# Patient Record
Sex: Male | Born: 1940
Health system: Southern US, Community
[De-identification: ages and names within clinical notes are randomized; demographics above are authoritative.]

## PROBLEM LIST (undated history)

## (undated) DIAGNOSIS — I499 Cardiac arrhythmia, unspecified: Secondary | ICD-10-CM

## (undated) DIAGNOSIS — H269 Unspecified cataract: Secondary | ICD-10-CM

## (undated) DIAGNOSIS — C801 Malignant (primary) neoplasm, unspecified: Secondary | ICD-10-CM

## (undated) DIAGNOSIS — D126 Benign neoplasm of colon, unspecified: Secondary | ICD-10-CM

## (undated) DIAGNOSIS — N401 Enlarged prostate with lower urinary tract symptoms: Secondary | ICD-10-CM

## (undated) DIAGNOSIS — L280 Lichen simplex chronicus: Secondary | ICD-10-CM

## (undated) DIAGNOSIS — I428 Other cardiomyopathies: Secondary | ICD-10-CM

## (undated) DIAGNOSIS — H9193 Unspecified hearing loss, bilateral: Secondary | ICD-10-CM

## (undated) DIAGNOSIS — R972 Elevated prostate specific antigen [PSA]: Secondary | ICD-10-CM

## (undated) HISTORY — DX: Other cardiomyopathies: I42.8

## (undated) HISTORY — DX: Unspecified hearing loss, bilateral: H91.93

## (undated) HISTORY — DX: Elevated prostate specific antigen (PSA): R97.20

## (undated) HISTORY — DX: Benign neoplasm of colon, unspecified: D12.6

## (undated) HISTORY — DX: Unspecified cataract: H26.9

## (undated) HISTORY — DX: Malignant (primary) neoplasm, unspecified: C80.1

## (undated) HISTORY — DX: Cardiac arrhythmia, unspecified: I49.9

## (undated) HISTORY — DX: Benign prostatic hyperplasia with lower urinary tract symptoms: N40.1

## (undated) HISTORY — PX: POLYPECTOMY: SHX149

## (undated) HISTORY — PX: PROSTATE SURGERY: SHX751

## (undated) HISTORY — PX: COLONOSCOPY: SHX174

## (undated) HISTORY — DX: Lichen simplex chronicus: L28.0

---

## 2008-07-08 ENCOUNTER — Encounter: Payer: Self-pay | Admitting: Family Medicine

## 2009-01-19 ENCOUNTER — Ambulatory Visit: Payer: Self-pay | Admitting: Family Medicine

## 2009-01-19 DIAGNOSIS — B49 Unspecified mycosis: Secondary | ICD-10-CM | POA: Insufficient documentation

## 2009-01-19 DIAGNOSIS — R21 Rash and other nonspecific skin eruption: Secondary | ICD-10-CM | POA: Insufficient documentation

## 2009-01-24 ENCOUNTER — Ambulatory Visit: Payer: Self-pay | Admitting: Family Medicine

## 2009-02-03 ENCOUNTER — Ambulatory Visit: Payer: Self-pay | Admitting: Family Medicine

## 2009-02-03 DIAGNOSIS — T169XXA Foreign body in ear, unspecified ear, initial encounter: Secondary | ICD-10-CM | POA: Insufficient documentation

## 2009-02-03 DIAGNOSIS — L280 Lichen simplex chronicus: Secondary | ICD-10-CM

## 2009-02-03 HISTORY — DX: Lichen simplex chronicus: L28.0

## 2009-04-26 ENCOUNTER — Telehealth (INDEPENDENT_AMBULATORY_CARE_PROVIDER_SITE_OTHER): Payer: Self-pay | Admitting: *Deleted

## 2009-07-25 ENCOUNTER — Ambulatory Visit: Payer: Self-pay | Admitting: Family Medicine

## 2009-07-25 DIAGNOSIS — D126 Benign neoplasm of colon, unspecified: Secondary | ICD-10-CM | POA: Insufficient documentation

## 2009-07-25 DIAGNOSIS — N138 Other obstructive and reflux uropathy: Secondary | ICD-10-CM | POA: Insufficient documentation

## 2009-07-25 DIAGNOSIS — N401 Enlarged prostate with lower urinary tract symptoms: Secondary | ICD-10-CM

## 2009-07-25 HISTORY — DX: Benign prostatic hyperplasia with lower urinary tract symptoms: N40.1

## 2009-07-25 HISTORY — DX: Benign neoplasm of colon, unspecified: D12.6

## 2009-07-25 HISTORY — DX: Other obstructive and reflux uropathy: N13.8

## 2009-07-27 ENCOUNTER — Telehealth: Payer: Self-pay | Admitting: Family Medicine

## 2009-07-27 DIAGNOSIS — R972 Elevated prostate specific antigen [PSA]: Secondary | ICD-10-CM | POA: Insufficient documentation

## 2009-07-27 HISTORY — DX: Elevated prostate specific antigen (PSA): R97.20

## 2009-07-27 LAB — CONVERTED CEMR LAB: PSA: 2.55 ng/mL (ref 0.10–4.00)

## 2009-08-01 ENCOUNTER — Telehealth: Payer: Self-pay | Admitting: Family Medicine

## 2009-08-10 ENCOUNTER — Ambulatory Visit: Payer: Self-pay | Admitting: Gastroenterology

## 2009-08-11 ENCOUNTER — Encounter (INDEPENDENT_AMBULATORY_CARE_PROVIDER_SITE_OTHER): Payer: Self-pay | Admitting: *Deleted

## 2009-08-19 ENCOUNTER — Ambulatory Visit: Payer: Self-pay | Admitting: Gastroenterology

## 2009-08-19 LAB — HM COLONOSCOPY

## 2009-08-23 ENCOUNTER — Telehealth: Payer: Self-pay | Admitting: Family Medicine

## 2009-08-31 ENCOUNTER — Encounter: Payer: Self-pay | Admitting: Gastroenterology

## 2009-09-28 ENCOUNTER — Encounter: Payer: Self-pay | Admitting: Family Medicine

## 2009-10-13 ENCOUNTER — Encounter: Payer: Self-pay | Admitting: Family Medicine

## 2009-10-24 ENCOUNTER — Encounter: Payer: Self-pay | Admitting: Family Medicine

## 2009-11-09 ENCOUNTER — Encounter: Payer: Self-pay | Admitting: Family Medicine

## 2009-12-05 ENCOUNTER — Inpatient Hospital Stay (HOSPITAL_COMMUNITY): Admission: RE | Admit: 2009-12-05 | Discharge: 2009-12-06 | Payer: Self-pay | Admitting: Urology

## 2009-12-05 ENCOUNTER — Encounter (INDEPENDENT_AMBULATORY_CARE_PROVIDER_SITE_OTHER): Payer: Self-pay | Admitting: Urology

## 2009-12-13 ENCOUNTER — Encounter: Payer: Self-pay | Admitting: Family Medicine

## 2010-01-27 ENCOUNTER — Encounter: Payer: Self-pay | Admitting: Family Medicine

## 2010-06-07 ENCOUNTER — Encounter: Payer: Self-pay | Admitting: Family Medicine

## 2010-07-13 ENCOUNTER — Ambulatory Visit: Payer: Self-pay | Admitting: Internal Medicine

## 2010-07-13 ENCOUNTER — Ambulatory Visit: Payer: Self-pay | Admitting: Family Medicine

## 2010-07-13 ENCOUNTER — Encounter: Payer: Self-pay | Admitting: Family Medicine

## 2010-07-13 DIAGNOSIS — R079 Chest pain, unspecified: Secondary | ICD-10-CM | POA: Insufficient documentation

## 2010-07-21 ENCOUNTER — Ambulatory Visit: Payer: Self-pay | Admitting: Cardiovascular Disease

## 2010-07-31 ENCOUNTER — Ambulatory Visit (HOSPITAL_COMMUNITY): Admission: RE | Admit: 2010-07-31 | Discharge: 2010-07-31 | Payer: Self-pay | Admitting: Cardiovascular Disease

## 2010-07-31 ENCOUNTER — Encounter: Payer: Self-pay | Admitting: Cardiovascular Disease

## 2010-07-31 ENCOUNTER — Ambulatory Visit: Payer: Self-pay

## 2010-07-31 ENCOUNTER — Ambulatory Visit: Payer: Self-pay | Admitting: Internal Medicine

## 2010-08-08 ENCOUNTER — Ambulatory Visit: Payer: Self-pay | Admitting: Cardiovascular Disease

## 2010-08-08 DIAGNOSIS — I428 Other cardiomyopathies: Secondary | ICD-10-CM

## 2010-08-08 DIAGNOSIS — Z8679 Personal history of other diseases of the circulatory system: Secondary | ICD-10-CM | POA: Insufficient documentation

## 2010-08-08 HISTORY — DX: Other cardiomyopathies: I42.8

## 2010-08-23 ENCOUNTER — Encounter: Payer: Self-pay | Admitting: Family Medicine

## 2010-10-31 NOTE — Assessment & Plan Note (Signed)
Summary: Chest tightness/sweating/cb   Vital Signs:  Patient profile:   70 year old male Weight:      188 pounds Temp:     98.7 degrees F oral BP sitting:   140 / 100  (left arm) Cuff size:   regular  Vitals Entered By: Sid Falcon LPN (July 13, 2010 11:05 AM)  History of Present Illness: Patient seen as a walk-in with chest discomfort which occurred around 10:30 AM today. He was moving some furniture and had rather acute onset of left substernal chest discomfort with some possible radiation to the left upper extremity. Had some associated dyspnea and elevated heart rate around 120 beats per minute with some irregularity noted. No prior history of CAD or atrial fibrillation. After rest symptoms resolved after about 10 minutes. He has no chest discomfort or dyspnea at this time.  He has past history of elevated blood pressure without diagnosis of hypertension. Nonsmoker. Lipids have been favorable. Couple of uncles with heart disease but no first degree relatives. No history of diabetes  Patient reports hx catheterization about 15 years ago in [place] which was normal.    Allergies: No Known Drug Allergies  Past History:  Family History: Last updated: 01/19/2009 Family History of Arthritis Breast cancer, grandmother  Social History: Last updated: 01/19/2009 Retired  Past Medical History: ?Heart arrhythmia Polyps in the colon BPH Prostate cancer PMH-FH-SH reviewed for relevance  Review of Systems  The patient denies anorexia, fever, weight loss, syncope, peripheral edema, prolonged cough, hemoptysis, abdominal pain, melena, hematochezia, and severe indigestion/heartburn.    Physical Exam  General:  Well-developed,well-nourished,in no acute distress; alert,appropriate and cooperative throughout examination Mouth:  Oral mucosa and oropharynx without lesions or exudates.  Teeth in good repair. Neck:  No deformities, masses, or tenderness noted. Lungs:  Normal  respiratory effort, chest expands symmetrically. Lungs are clear to auscultation, no crackles or wheezes. Heart:  pulse around 90 with occasional premature beat Abdomen:  soft and non-tender.   Extremities:  no edema Neurologic:  alert & oriented X3, cranial nerves II-XII intact, and strength normal in all extremities.   Cervical Nodes:  No lymphadenopathy noted Psych:  normally interactive, good eye contact, not anxious appearing, and not depressed appearing.     Impression & Recommendations:  Problem # 1:  CHEST PAIN (ICD-786.50) exertional chest pain this AM-?exertional angina..  Pt asymptomatic now with no acute changes .  He will be seen at 1:45 PM today at [facility]. Orders: EKG w/ Interpretation (93000)  Complete Medication List: 1)  Avodart 0.5 Mg Caps (Dutasteride) .... Once daily 2)  Aspirin Adult Low Strength 81 Mg Tbec (Aspirin) .... Once daily 3)  Centrum Silver Ultra Mens Tabs (Multiple vitamins-minerals) .... Once daily 4)  Vosol Hc 2-1 % Soln (Hydrocortisone-acetic acid) .... 2 to 3 gtts left ear three times a day for 7 days 5)  Triamcinolone Acetonide 0.1 % Crea (Triamcinolone acetonide) .... Apply to affected rash two times a day as needed 6)  Transderm-scop 1.5 Mg Pt72 (Scopolamine base) .... One patch behind ear q 3 days as needed 7)  Cipro 500 Mg Tabs (Ciprofloxacin hcl) .... One by mouth two times a day for 5 days 8)  Chloroquine Phosphate 500 Mg Tabs (Chloroquine phosphate) .... One by mouth starting 2 weeks prior to travel & continuing 8 weeks after return 9)  Vivotif Berna Vaccine Cpdr (Typhoid vaccine) .... One tab by mouth every other day for 4 doses starting today  Patient Instructions: 1)  No  exertional activity for now. 2)  Go to [facility] at 1:45 PM today. 3)  Follow up immediately if you have any recurrent chest pain or shortness of breath.

## 2010-10-31 NOTE — Progress Notes (Signed)
 Summary: Wants sea sickness patch  Phone Note Call from Patient   Summary of Call: Patient wants to get a script sent to the pharmacy for the sea sickness patch. Pharm/[facility].  Initial call taken by: Olam Mare RMA,  April 26, 2009 1:33 PM  Follow-up for Phone Call        OK to send Rx for Scopolamine  patch 1 patch behind ear q 3 days as needed. Follow-up by: Wolm Scarlet MD,  April 26, 2009 1:39 PM  Additional Follow-up for Phone Call Additional follow up Details #1::        Patient informed. Additional Follow-up by: Olam Mare RMA,  April 26, 2009 1:48 PM    New/Updated Medications: TRANSDERM-SCOP 1.5 MG PT72 (SCOPOLAMINE  BASE) one patch behind ear q 3 days as needed Prescriptions: TRANSDERM-SCOP 1.5 MG PT72 (SCOPOLAMINE  BASE) one patch behind ear q 3 days as needed  #6 x 0   Entered by:   Olam Mare RMA   Authorized by:   Wolm Scarlet MD   Signed by:   Olam Mare RMA on 04/26/2009   Method used:   Electronically to        [facility]. #[id number]* (retail)       [address]       [place], [place]  [address]       Ph: [phone]       Fax: [phone]   RxID:   [id number]

## 2010-10-31 NOTE — Progress Notes (Signed)
 Summary: Needs Typhoid Med for Trip to [place]  Phone Note Call from Patient Call back at [facility] Phone [phone]   Caller: Patient Summary of Call: Pt is leaving for [place] on Sunday and was advised that he needs Thyphoid medication before leaving.  Please call this in to [facility] [address]  If any questions or concerns please call me at home # Initial call taken by: Suandrea Lashan Miles,  August 23, 2009 3:40 PM  Follow-up for Phone Call        Vivotif one by mouth every other day for 4 doses starting today.  Disp #4 with no refill. Follow-up by: Alize Acy MD,  August 24, 2009 11:18 AM  Additional Follow-up for Phone Call Additional follow up Details #1::        Rx sent, pt informed on personally identified home VM Additional Follow-up by: Nancy Neckers LPN,  August 24, 2009 12:16 PM    New/Updated Medications: VIVOTIF BERNA VACCINE  CPDR (TYPHOID VACCINE) one tab by mouth every other day for 4 doses starting today Prescriptions: VIVOTIF BERNA VACCINE  CPDR (TYPHOID VACCINE) one tab by mouth every other day for 4 doses starting today  #4 x 0   Entered by:   Nancy Neckers LPN   Authorized by:   Kaemon Barnett MD   Signed by:   Nancy Neckers LPN on 08/24/2009   Method used:   Electronically to        [facility]  [address] #[id number]* (retail)       [address]       [place], [place]  [address]       Ph: [phone]       Fax: [phone]   RxID:   [id number]

## 2010-10-31 NOTE — Progress Notes (Signed)
 Summary: RETURNING A CALL  Phone Note Call from Patient Call back at Home Phone [phone]   Caller: Patient-LIVE CALL Reason for Call: Talk to Doctor Summary of Call: RETURNING DR Breniya Goertzen'S CALL. Initial call taken by: Montie Arzella Ada,  July 27, 2009 9:45 AM  Follow-up for Phone Call        pt call again please call home/after 12.30pm call cell [phone] Follow-up by: Madeline Cy Essex,  July 27, 2009 11:37 AM  Additional Follow-up for Phone Call Additional follow up Details #1::        spoke with pt and reviewed lab.  Urology referral made. Additional Follow-up by: Wolm Scarlet MD,  July 27, 2009 5:43 PM

## 2010-10-31 NOTE — Letter (Signed)
 Summary: [facility]  [facility] Provider: This provider was preselected by the workflow.  Signature: The signature status of this document was preset by the workflow  Processed by [employer] Local Indexer Client @ Tuesday, August 16, 2009 2:54:56 PM using version:2010.1.2.11(2.4)   Manually Indexed By: [id number]  idlBatchDetail: [id number]   _____________________________________________________________________  External Attachment:    Type:   Image     Comment:   External Document

## 2010-10-31 NOTE — Progress Notes (Signed)
 Summary: Rxs needed  Phone Note Call from Patient Call back at Home Phone [phone]   Caller: vm Summary of Call: Malaria Rx needed is chloroquine 500mg  & Cipro needed. Initial call taken by: Sharlet Leta Erm, RN,  August 01, 2009 12:40 PM  Follow-up for Phone Call        [facility] to prescribe Cipro and Chloroquine as follows:  Cipro 500 mg one by mouth two times a day for 5 days as needed traveler's diarrhea, disp #10, 0 refill.  Chloroquine 500 mg one by mouth q week starting 2 weeks prior to travel and continuing 8 weeks after return, disp #12, 0 refill Follow-up by: Wolm Scarlet MD,  August 01, 2009 1:02 PM  Additional Follow-up for Phone Call Additional follow up Details #1::        Wife says send to [facility].  Done.    New/Updated Medications: CIPRO 500 MG TABS (CIPROFLOXACIN HCL) One by mouth two times a day for 5 days CHLOROQUINE PHOSPHATE 500 MG TABS (CHLOROQUINE PHOSPHATE) One by mouth starting 2 weeks prior to travel & continuing 8 weeks after return Prescriptions: CHLOROQUINE PHOSPHATE 500 MG TABS (CHLOROQUINE PHOSPHATE) One by mouth starting 2 weeks prior to travel & continuing 8 weeks after return  #12 x 0   Entered by:   Sharlet Leta Erm, RN   Authorized by:   Wolm Scarlet MD   Signed by:   Sharlet Leta Erm, RN on 08/01/2009   Method used:   Electronically to        [facility]. [phone]* (retail)       [phone]       [place], [place]  [address]       Ph: [phone]       Fax: [phone]   RxID:   [id number] CIPRO 500 MG TABS (CIPROFLOXACIN HCL) One by mouth two times a day for 5 days  #10 x 0   Entered by:   Sharlet Leta Erm, RN   Authorized by:   Wolm Scarlet MD   Signed by:   Sharlet Leta Erm, RN on 08/01/2009   Method used:   Electronically to        [facility]. [phone]* (retail)       [phone]       [place], [place]  [address]       Ph: [phone]       Fax: [phone]   RxID:   [id number]

## 2010-10-31 NOTE — Op Note (Signed)
Summary: Transrectal Ultrasound of the prostate + biopsy  Transrectal Ultrasound of the prostate + biopsy   Imported By: Maryln Gottron 10/04/2009 13:36:45  _____________________________________________________________________  External Attachment:    Type:   Image     Comment:   External Document

## 2010-10-31 NOTE — Miscellaneous (Signed)
Summary: FLU VACCINE  Clinical Lists Changes  Observations: Added new observation of FLU VAX: Historical (07/27/2010 15:05)      Immunization History:  Tetanus/Td Immunization History:    Tetanus/Td:  Historical (10/02/2007)  Influenza Immunization History:    Influenza:  Historical (07/27/2010)  Pneumovax Immunization History:    Pneumovax:  given (10/02/2007)  Hepatitis A Immunization History:    Hepatitis A # 1:  HepA (07/25/2009)  Zostavax History:    Zostavax # 1:  Zostavax (05/01/2008)  FLU GIVEN AT [facility]. Mark Pruitt

## 2010-10-31 NOTE — Assessment & Plan Note (Signed)
Summary: chest pain   Visit Type:  Follow-up Primary Provider:  Evelena Peat MD   History of Present Illness: [name] is referred today as an add-on by Dr. Caryl Never for evaluation of chest pain associated with palpitations.  The patient has no significant cardiac history.  He notes a heart catheterization back 15 yrs ago which by his report was negative.  He was moving furniture earlier today when he experienced SSCP.  No radiation.  He noted some associated palpitations.  He was sob and experienced diaphoresis.  After resting his symptoms resolved as did his palpitations.  No radiation to the neck/jaw/arm/shoulder.  Current Medications (verified): 1)  Aspirin Adult Low Strength 81 Mg Tbec (Aspirin) .... Once Daily 2)  Centrum Silver Ultra Mens  Tabs (Multiple Vitamins-Minerals) .... Once Daily 3)  Transderm-Scop 1.5 Mg Pt72 (Scopolamine Base) .... One Patch Behind Ear Q 3 Days As Needed  Allergies (verified): No Known Drug Allergies  Past History:  Past Medical History: Last updated: 07/13/2010 ?Heart arrhythmia Polyps in the colon BPH Prostate cancer  Past Surgical History: Prostate CA, s/p surgery  Social History: Retired Denies ETOH Stopped smoking in the 1980's  Review of Systems       All systems reviewed and negative except as noted in the HPI except for rare palpitations.  Vital Signs:  Patient profile:   70 year old male Height:      72 inches Weight:      187 pounds BMI:     25.45 Pulse rate:   76 / minute BP sitting:   110 / 72  (left arm)  Vitals Entered By: Laurance Flatten CMA (July 13, 2010 2:01 PM)  Physical Exam  General:  Well developed, well nourished, in no acute distress.  HEENT: normal Neck: supple. No JVD. Carotids 2+ bilaterally no bruits Cor: RRR no rubs, gallops or murmur Lungs: CTA with no wheezes, rales, or rhonchi Ab: soft, nontender. nondistended. No HSM. Good bowel sounds Ext: warm. no cyanosis, clubbing or  edema Neuro: alert and oriented. Grossly nonfocal. affect pleasant    EKG  Procedure date:  07/13/2010  Findings:      Normal sinus rhythm with rate of:  64.  Impression & Recommendations:  Problem # 1:  CHEST PAIN (ICD-786.50) The etiology is unclear but concerning for crescendo angina.  There are no rest symptoms and his ECG is benign.  I have recommended he undergo exercise stress testing and to continue his low dose ASA.  Until his stress test, he is instructed not to exercise or do any vigorous activity. His updated medication list for this problem includes:    Aspirin Adult Low Strength 81 Mg Tbec (Aspirin) ..... Once daily  Orders: Treadmill (Treadmill)  Patient Instructions: 1)  Your physician has requested that you have an exercise tolerance test.  For further information please visit [email or web address]  Please also follow instruction sheet, as given.

## 2010-10-31 NOTE — Letter (Signed)
Summary: [facility]  [facility]   Imported By: Maryln Gottron 02/02/2010 13:25:21  _____________________________________________________________________  External Attachment:    Type:   Image     Comment:   External Document

## 2010-10-31 NOTE — Procedures (Signed)
 Summary: Colonoscopy  Patient: [name] Note: All result statuses are Final unless otherwise noted.  Tests: (1) Colonoscopy (COL)   COL Colonoscopy           DONE     [facility]     [address]     [place], [place]  [address]           COLONOSCOPY PROCEDURE REPORT     PATIENT:  Mark, Pruitt  MR#:  [id number]     BIRTHDATE:  August 01, 1941, 68 yrs. old  GENDER:  male     ENDOSCOPIST:  Toribio MYRTIS Cedar, MD     Referred by:  Wolm Scarlet, M.D.     PROCEDURE DATE:  08/19/2009     PROCEDURE:  Colonoscopy with snare polypectomy     ASA CLASS:  Class II     INDICATIONS:  history of pre-cancerous (outside facility, 3-4     years ago, pt was told the polyps were precancerous) colon polyps           MEDICATIONS:   Fentanyl  50 mcg IV, Versed  6 mg IV     DESCRIPTION OF PROCEDURE:   After the risks benefits and     alternatives of the procedure were thoroughly explained, informed     consent was obtained.  Digital rectal exam was performed and     revealed no rectal masses.   The LB [id number] endoscope     was introduced through the anus and advanced to the cecum, which     was identified by both the appendix and ileocecal valve, without     limitations.  The quality of the prep was excellent, using     MoviPrep .  The instrument was then slowly withdrawn as the colon     was fully examined.     <<PROCEDUREIMAGES>>     FINDINGS:  A sessile polyp was found. This was in very distal     rectum (at proximal anal verge), 5mm across and was remove with     snare, cautery and sent to pathology (see image7, image8, and     image10). Mild diverticulosis was found sigmoid to descending     colon segments.  This was otherwise a normal examination of the     colon (see image2). Retroflexed views in the rectum revealed no     abnormalities.  The scope was then withdrawn from the patient and     the procedure completed.     COMPLICATIONS:  None           ENDOSCOPIC  IMPRESSION:     1) Small sessile polyp, removed and sent to pathology     2) Mild diverticulosis in the sigmoid to descending colon     segemnts     3) Otherwise normal examination           RECOMMENDATIONS:     1) If the polyp(s) removed today are proven to be adenomatous     (pre-cancerous) polyps, you will need a repeat colonoscopy in 5     years.     2) You will receive a letter within 1-2 weeks with the results     of your biopsy as well as final recommendations. Please call my     office if you have not received a letter after 3 weeks.           REPEAT EXAM:  await pathology  ______________________________     Toribio MYRTIS Cedar, MD           n.     eSIGNED:   Toribio MYRTIS Cedar at 08/19/2009 09:56 AM           [name], [id number]  Note: An exclamation mark (!) indicates a result that was not dispersed into the flowsheet. Document Creation Date: 08/19/2009 9:56 AM _______________________________________________________________________  (1) Order result status: Final Collection or observation date-time: 08/19/2009 09:49 Requested date-time:  Receipt date-time:  Reported date-time:  Referring Physician:   Ordering Physician: Toribio Cedar [phone]) Specimen Source:  Source: Rada Bray Order Number: [id number] Lab site:   Appended Document: Colonoscopy recall     Procedures Next Due Date:    Colonoscopy: 08/2014

## 2010-10-31 NOTE — Assessment & Plan Note (Signed)
 Summary: to discuss getting hep inj/referral colonoscopy/fu on meds/njr   Vital Signs:  Patient profile:   70 year old male Temp:     98.7 degrees F oral BP sitting:   120 / 80  (left arm) Cuff size:   regular  Vitals Entered By: Inocente Gallon LPN (July 25, 2009 4:01 PM) CC: Discuss hep A & B for trip to [place], coloncopy referral   History of Present Illness: Patient here for followup regarding multiple items as follows  Prior colonoscopy approximately 3 years ago. He was told that three-year followup (?adenomatous polyps). He was scheduled last year but wife had emergent surgery and he postponed. He needs to get rescheduled at this time.  Previous colonoscopy elsewhere.  Upcoming trip to [place]. He needs hepatitis A vaccine. Has also not had flu vaccine at this point.  Will need malaria prophylaxis but has not gotten details regarding recommended meds.  patient has history of BPH. On Avodart 0.5 mg daily. Last PSA last year 2.1. No obstructive urinary symptoms.  No signif nocturia.  No med side effects. Needs refills.    Allergies (verified): No Known Drug Allergies  Past History:  Family History: Last updated: 01/19/2009 Family History of Arthritis Breast cancer, grandmother  Social History: Last updated: 01/19/2009 Retired  Past Medical History: Heart arrhythemia Polyps in the colon BPH  Review of Systems  The patient denies anorexia, fever, weight loss, chest pain, abdominal pain, melena, hematochezia, severe indigestion/heartburn, hematuria, and incontinence.    Physical Exam  General:  Well-developed,well-nourished,in no acute distress; alert,appropriate and cooperative throughout examination Mouth:  Oral mucosa and oropharynx without lesions or exudates.  Teeth in good repair. Neck:  No deformities, masses, or tenderness noted. Lungs:  Normal respiratory effort, chest expands symmetrically. Lungs are clear to auscultation, no crackles or wheezes. Heart:   Normal rate and regular rhythm. S1 and S2 normal without gallop, murmur, click, rub or other extra sounds. Abdomen:  Bowel sounds positive,abdomen soft and non-tender without masses, organomegaly or hernias noted. Extremities:  No clubbing, cyanosis, edema, or deformity noted with normal full range of motion of all joints.     Impression & Recommendations:  Problem # 1:  HYPERTROPHY PROSTATE W/UR OBST & OTH LUTS (ICD-600.01) Refill Avodart. repeat PSA.  Orders: TLB-PSA (Prostate Specific Antigen) (84153-PSA)  Problem # 2:  COLONIC POLYPS (ICD-211.3)  Orders: Gastroenterology Referral (GI)  Problem # 3:  Preventive Health Care (ICD-V70.0) Hepatitis A vaccine given. Flu vaccine given. Tetanus up-to-date.  Pt will call regarding specific antimalarial. Rec repeat Hep A in 6 months.  Complete Medication List: 1)  Avodart 0.5 Mg Caps (Dutasteride) .... Once daily 2)  Aspirin  Adult Low Strength 81 Mg Tbec (Aspirin ) .... Once daily 3)  Centrum Silver Ultra Mens Tabs (Multiple vitamins-minerals) .... Once daily 4)  Vosol  Hc 2-1 % Soln (Hydrocortisone -acetic acid ) .... 2 to 3 gtts left ear three times a day for 7 days 5)  Triamcinolone  Acetonide 0.1 % Crea (Triamcinolone  acetonide) .... Apply to affected rash two times a day as needed 6)  Transderm-scop 1.5 Mg Pt72 (Scopolamine  base) .... One patch behind ear q 3 days as needed  Other Orders: Admin 1st Vaccine (90471) Flu Vaccine 2yrs + (09341) Hepatitis A Vaccine (Adult Dose) (09367) Admin of Any Addtl Vaccine (09527)  Patient Instructions: 1)  Confirm type of malaria prophylaxis and give us  a call so we can call that in. Prescriptions: AVODART 0.5 MG CAPS (DUTASTERIDE) once daily  #30 x 11   Entered  and Authorized by:   Wolm Scarlet MD   Signed by:   Wolm Scarlet MD on 07/25/2009   Method used:   Electronically to        [facility]. #[id number]* (retail)       [address]       [place], [place]  [address]       Ph: [phone]       Fax: [phone]   RxID:   [id number]     Immunizations Administered:  Hepatitis A Vaccine # 1:    Vaccine Type: HepA    Site: right deltoid    Mfr: [employer]    Dose: 1.0 ml    Route: IM    Given by: Inocente Gallon LPN    Exp. Date: 10/29/2011    Lot #: [id number]  Flu Vaccine Consent Questions     Do you have a history of severe allergic reactions to this vaccine? no    Any prior history of allergic reactions to egg and/or gelatin? no    Do you have a sensitivity to the preservative Thimersol? no    Do you have a past history of Guillan-Barre Syndrome? no    Do you currently have an acute febrile illness? no    Have you ever had a severe reaction to latex? no    Vaccine information given and explained to patient? yes    Are you currently pregnant? no    Lot Number:[id number]   Exp Date:03/30/2010   Site Given  Left Deltoid IMbflu

## 2010-10-31 NOTE — Assessment & Plan Note (Signed)
 Summary: 10 day rov/njr   Vital Signs:  Patient profile:   70 year old male Temp:     97.5 degrees F oral BP sitting:   100 / 70  (left arm) Cuff size:   regular  Vitals Entered By: Inocente Gallon LPN (Feb 04, 7988 8:29 AM) CC: 10 day F/U   History of Present Illness: Patient here for followup regarding the following items. First he has had over 6 month history of rash right buttock and we performed biopsies 10 days ago. Pathology is still pending. He had no pain or drainage from biopsy site. Needs suture removal. Also black and mass left ear canal which we presumed it is fungal in etiology. He is use VoSoL  drops and has gotten some blackened substance out. He has no associated pain. He has chronic hearing changes which are unchanged. He used eardrops for about 9 days.  No further ear pain.  Allergies: No Known Drug Allergies PMH reviewed for relevance  Review of Systems  The patient denies fever, weight loss, and headaches.         he has chronic hearing loss bilaterally  Physical Exam  General:  Well-developed,well-nourished,in no acute distress; alert,appropriate and cooperative throughout examination Ears:  right canal is clear.  left canal reveals along the inferior portion of the canal up against the eardrum region a blackened subsstance and appear to be some possible spores in the periphery.  Portion of eardrum visualized appears normal. No erythema the canal.  After irrigation, blackened substance (? foam) removed. Neck:  no adenopathy Skin:  right buttock is examined. He has an area about 1 x 1 cm mild erythematous pinkish in color with possibly some mild thickening of the skin in this region. Sutures removed and wound is well-healed. There is no drainage or warmth.   Impression & Recommendations:  biopsy pending and we've called for results.  Pathology came back during his visits here with lichen simplex chronicus. Avoid further scratching. We'll prescribe triamcinolone   0.1% cream to use p.r.n. for any pruritus.  We explained these raches are frequently chronic and difficult to eradicate.  His updated medication list for this problem includes:    Triamcinolone  Acetonide 0.1 % Crea (Triamcinolone  acetonide) .SABRA... Apply to affected rash two times a day as needed  Problem # 2:  FOREIGN BODY, EAR (ICD-931)  After irrigation we obtained some blackened material that on closer inspection looks like some foam. This was removed in entirety.  Complete Medication List: 1)  Avodart 0.5 Mg Caps (Dutasteride) .... Once daily 2)  Aspirin  Adult Low Strength 81 Mg Tbec (Aspirin ) .... Once daily 3)  Centrum Silver Ultra Mens Tabs (Multiple vitamins-minerals) .... Once daily 4)  Vosol  Hc 2-1 % Soln (Hydrocortisone -acetic acid ) .... 2 to 3 gtts left ear three times a day for 7 days 5)  Triamcinolone  Acetonide 0.1 % Crea (Triamcinolone  acetonide) .... Apply to affected rash two times a day as needed  Patient Instructions: 1)  Oblique scratching the area of rash.  Use corticosteroid cream as needed for itching. 2)  Please schedule a follow-up appointment as needed .  Prescriptions: TRIAMCINOLONE  ACETONIDE 0.1 % CREA (TRIAMCINOLONE  ACETONIDE) apply to affected rash two times a day as needed  #15 gm x 2   Entered and Authorized by:   Wolm Scarlet MD   Signed by:   Wolm Scarlet MD on 02/03/2009   Method used:   Electronically to        [facility] [phone]* (retail)       [address]       [place], [place]  [address]       Ph: [phone]       Fax: [phone]   RxID:   [id number]

## 2010-10-31 NOTE — Letter (Signed)
 Summary: Results Letter  [facility]  [address] [place], [place] [address]   Phone: [phone]  Fax: [phone]        August 31, 2009 MRN: [id number]    RASHAWD LASKARIS [address] [place], [place]  [address]    Dear Mr. Marcucci,    Good news.  The polyp(s) that were removed during your recent procedure were NOT pre-cancerous.  Given your personal history of precancerous polyps, however,  you will need a repeat colonoscopy in 5 years.  We will put your information in our reminder system and contact you around that time.  Please call if you have any questions or concerns.        Sincerely,  Toribio SHAUNNA Cedar MD  This letter has been electronically signed by your physician.  Appended Document: Results Letter Letter mailed 12.02.10.

## 2010-10-31 NOTE — Letter (Signed)
Summary: [facility]  [facility]   Imported By: Maryln Gottron 10/21/2009 15:45:18  _____________________________________________________________________  External Attachment:    Type:   Image     Comment:   External Document

## 2010-10-31 NOTE — Letter (Signed)
Summary: [facility]  [facility]   Imported By: Maryln Gottron 12/21/2009 15:32:06  _____________________________________________________________________  External Attachment:    Type:   Image     Comment:   External Document

## 2010-10-31 NOTE — Letter (Signed)
Summary: [facility]  [facility]   Imported By: Maryln Gottron 11/16/2009 10:58:10  _____________________________________________________________________  External Attachment:    Type:   Image     Comment:   External Document

## 2010-10-31 NOTE — Assessment & Plan Note (Signed)
 Summary: ROA/SKIN BIOPSY/RCD   Vital Signs:  Patient profile:   70 year old male Temp:     97.8 degrees F oral BP sitting:   110 / 60  (left arm) Cuff size:   regular  Vitals Entered By: Inocente Gallon LPN (January 24, 2009 10:51 AM) CC: Left ear follow-up, skin biopsy    History of Present Illness: Patient seen for followup of a nonhealing rash on his right buttock which has been present for least 6 months. Is occasionally slightly irritated and sore and seems to wax and wane. Given the duration of involvement we've recommended biopsy and is here for that today. Also had recent problems with left ear and we suspected fungal otitis externa. He is sensing some symptomatic improvement with drops.  Allergies: No Known Drug Allergies  Past History:  Past Medical History:    Heart arrhythemia    Polyps in the colon (01/19/2009)  Review of Systems      See HPI  Physical Exam  General:  Well-developed,well-nourished,in no acute distress; alert,appropriate and cooperative throughout examination Skin:  right buttock reveals an area approximately 1.5 x 2 cm which is mildly erythematous and mildly thickened in texture. No pigmentary change.   Impression & Recommendations:  Problem # 1:  SKIN RASH (ICD-782.1)  Nonhealing rash right buttock region for at least 6 months. We have previously recommended punch biopsy to further assess Going over risks and benefits patient consented. We anesthetized the area with 1% Xylocaine  with epinephrine after cleaning the skin with alcohol.  Punch biopsy 5 mm taken without difficulty. Minimal bleeding. Sutured with 4-0 Ethilon wound care instruction given. Specimen sent to pathology.  Orders: Biopsy (Punch) Skin, Single Lesion ([address])  Complete Medication List: 1)  Avodart 0.5 Mg Caps (Dutasteride) .... Once daily 2)  Aspirin  Adult Low Strength 81 Mg Tbec (Aspirin ) .... Once daily 3)  Centrum Silver Ultra Mens Tabs (Multiple vitamins-minerals) ....  Once daily 4)  Vosol  Hc 2-1 % Soln (Hydrocortisone -acetic acid ) .... 2 to 3 gtts left ear three times a day for 7 days  Patient Instructions: 1)  Schedule followup visit in 10 days to reassess.  Followup promptly if he notices any redness, drainage, fever, or chills keep area dry as possible for the first 24 hours then clean keep clean with soap and water. Use topical antibiotic such as Neosporin for the next 3-4 days

## 2010-10-31 NOTE — Miscellaneous (Signed)
 Summary: [facility] Previsit/prep  Clinical Lists Changes  Medications: Added new medication of MOVIPREP  100 GM  SOLR (PEG-KCL-NACL-NASULF-NA ASC-C) As per prep instructions. - Signed Rx of MOVIPREP  100 GM  SOLR (PEG-KCL-NACL-NASULF-NA ASC-C) As per prep instructions.;  #1 x 0;  Signed;  Entered by: Edna Saddler RN;  Authorized by: Toribio SHAUNNA Cedar MD;  Method used: Electronically to [facility] #[id number]*, [address] [place], [place], [place]  [address], Ph: [phone], Fax: [phone] Observations: Added new observation of NKA: T (08/10/2009 10:13)    Prescriptions: MOVIPREP  100 GM  SOLR (PEG-KCL-NACL-NASULF-NA ASC-C) As per prep instructions.  #1 x 0   Entered by:   Edna Saddler RN   Authorized by:   Toribio SHAUNNA Cedar MD   Signed by:   Edna Saddler RN on 08/10/2009   Method used:   Electronically to        [facility]. [phone]* (retail)       [phone]       [place], [place]  [address]       Ph: [phone]       Fax: [phone]   RxID:   [id number]

## 2010-10-31 NOTE — Letter (Signed)
Summary: [facility]  [facility]   Imported By: Maryln Gottron 06/15/2010 14:30:20  _____________________________________________________________________  External Attachment:    Type:   Image     Comment:   External Document

## 2010-10-31 NOTE — Assessment & Plan Note (Signed)
Summary: per check out/sf   Visit Type:  Follow-up Primary Provider:  Evelena Peat MD  CC:  irregular heart beat.  History of Present Illness: Mark Pruitt is a patient of Dr Ladona Ridgel who was scheduled for an ETT with me.  It was nonischemic.  However he had a lot of PVC's.  He has had these in the past and was on BB's.  F/U echo showed DCM with diffuse hypokinesis and EF 40-45%.  I started him on low dose lisinopril.  He is doing well with no SOB, palpitaiotns, SSCP edema or  syncope.  I discussed the diagnosis of DCM with him and rational for ACE Rx.  He will have a F/U MRI in 6 months.    Current Problems (verified): 1)  Chest Pain  (ICD-786.50) 2)  Hypertrophy Prostate W/ur Obst & Oth Luts  (ICD-600.01) 3)  Elevated Prostate Specific Antigen  (ICD-790.93) 4)  Colonic Polyps  (ICD-211.3) 5)  Hypertrophy Prostate W/ur Obst & Oth Luts  (ICD-600.01) 6)  Foreign Body, Ear  (ICD-931) 7)  Lichen Simplex Chronicus  (ICD-698.3) 8)  Skin Rash  (ICD-782.1) 9)  Fungal Infection  (ICD-117.9)  Current Medications (verified): 1)  Aspirin Adult Low Strength 81 Mg Tbec (Aspirin) .... Once Daily 2)  Centrum Silver Ultra Mens  Tabs (Multiple Vitamins-Minerals) .... Once Daily 3)  Lisinopril 5 Mg Tabs (Lisinopril) .... Take One Tablet By Mouth Daily 4)  Flax Seed Oil 1000 Mg Caps (Flaxseed (Linseed)) .... Take 1 Capsule By Mouth Once A Day 5)  Ester-C  Tabs (Bioflavonoid Products) .... Take 1 Tablet By Mouth Once A Day 6)  Calcium Citrate +  Tabs (Multiple Minerals-Vitamins) .... 4 A Day  Allergies (verified): No Known Drug Allergies  Past History:  Past Medical History: Last updated: 07/13/2010 ?Heart arrhythmia Polyps in the colon BPH Prostate cancer  Past Surgical History: Last updated: 07/13/2010 Prostate CA, s/p surgery  Family History: Last updated: 01/19/2009 Family History of Arthritis Breast cancer, grandmother  Social History: Last updated: 07/13/2010 Retired Denies  ETOH Stopped smoking in the 1980's  Review of Systems       Denies fever, malais, weight loss, blurry vision, decreased visual acuity, cough, sputum, SOB, hemoptysis, pleuritic pain, palpitaitons, heartburn, abdominal pain, melena, lower extremity edema, claudication, or rash.   Vital Signs:  Patient profile:   70 year old male Height:      72 inches Weight:      190 pounds BMI:     25.86 Pulse rate:   74 / minute Pulse rhythm:   irregular Resp:     18 per minute BP sitting:   112 / 66  (left arm) Cuff size:   large  Vitals Entered By: Vikki Ports (August 08, 2010 10:20 AM)  Physical Exam  General:  Affect appropriate Healthy:  appears stated age HEENT: normal Neck supple with no adenopathy JVP normal no bruits no thyromegaly Lungs clear with no wheezing and good diaphragmatic motion Heart:  S1/S2 no murmur,rub, gallop or click PMI normal Abdomen: benighn, BS positve, no tenderness, no AAA no bruit.  No HSM or HJR Distal pulses intact with no bruits No edema Neuro non-focal Skin warm and dry    Impression & Recommendations:  Problem # 1:  CHEST PAIN (ICD-786.50) Non ischemic ETT  Continue ASA His updated medication list for this problem includes:    Aspirin Adult Low Strength 81 Mg Tbec (Aspirin) ..... Once daily    Lisinopril 5 Mg Tabs (Lisinopril) .Marland Kitchen... Take one  tablet by mouth daily  Orders: Cardiac MRI (Cardiac MRI)  Problem # 2:  OTHER PRIMARY CARDIOMYOPATHIES (ICD-425.4) No evidence of CAD.  Tolerating low dose ACE.  F/U MRI in 6 months His updated medication list for this problem includes:    Aspirin Adult Low Strength 81 Mg Tbec (Aspirin) ..... Once daily    Lisinopril 5 Mg Tabs (Lisinopril) .Marland Kitchen... Take one tablet by mouth daily  Patient Instructions: 1)  Your physician recommends that you schedule a follow-up appointment in: 6 MONTHS WITH DR Eden Emms  2)  Your physician recommends that you continue on your current medications as directed. Please  refer to the Current Medication list given to you today. 3)  Your physician has requested that you have a cardiac MRI.  Cardiac MRI uses a computer to create images of your heart as it's beating, producing both still and moving pictures of your heart and major blood vessels. For further information please visit  [email or web address]  Please follow the instruction sheet given to you today for more information. 6 MONTHS  SEE DR Eden Emms AFTER

## 2010-10-31 NOTE — Letter (Signed)
Summary: [facility]  [facility]   Imported By: Maryln Gottron 10/31/2009 15:24:51  _____________________________________________________________________  External Attachment:    Type:   Image     Comment:   External Document

## 2010-10-31 NOTE — Assessment & Plan Note (Signed)
 Summary: EAR INFECTION/JLS   Vital Signs:  Patient profile:   70 year old male Height:      72 inches Weight:      188 pounds BMI:     25.59 Temp:     98.1 degrees F oral BP sitting:   110 / 62  (left arm) Cuff size:   regular  Vitals Entered By: Inocente Gallon LPN (January 19, 2009 1:06 PM) CC: [place] pt, to establish, left ear discomfort & discharge, buttock rash/hemmoroid   History of Present Illness: Patient 70 year old male seen for the following items today. He had 3-4 week history of some mild soreness in the left ear. Chronic hearing loss bilateral hearing aids.  Patient had been cleaning his ears with Q-tip and  he initially thought he saw some dried blood. No purulent secretions.  No recent hearing changes. Patient not immunocompromised.  Other issues he had  6 month history of rash right buttocks and mild sore area which has not healed over this time period he had avoids scratching the area. Has occasional mild pruritus. No bloody stools or any recent stool changes. Is not using anything topically on this area.  No recent appetite or weight changes.  Allergies (verified): No Known Drug Allergies  Past History:  Family History:    Family History of Arthritis    Breast cancer, grandmother     (01/19/2009)  Social History:    Retired     (01/19/2009)  Risk Factors:    Alcohol Use: N/A    >5 drinks/d w/in last 3 months: N/A    Caffeine Use: N/A    Diet: N/A    Exercise: N/A  Risk Factors:    Smoking Status: N/A    Packs/Day: N/A    Cigars/wk: N/A    Pipe Use/wk: N/A    Cans of tobacco/wk: N/A    Passive Smoke Exposure: N/A  Past Medical History:    Heart arrhythemia    Polyps in the colon  Family History:    Family History of Arthritis    Breast cancer, grandmother  Social History:    Retired  Review of Systems  The patient denies anorexia, fever, weight loss, headaches, abdominal pain, hematochezia, and abnormal bleeding.    Physical  Exam  General:  Well-developed,well-nourished,in no acute distress; alert,appropriate and cooperative throughout examination Head:  Normocephalic and atraumatic without obvious abnormalities. No apparent alopecia or balding. Ears:  left ear canal is relatively normal but then deep in the canal he has blackened discoloration and probably some samples blackened scores. There appears to be mild amount of cerumen as well. Eardrums not visualized. No evidence for any blood. Right ear canal is normal Lungs:  Normal respiratory effort, chest expands symmetrically. Lungs are clear to auscultation, no crackles or wheezes. Heart:  Normal rate and regular rhythm. S1 and S2 normal without gallop, murmur, click, rub or other extra sounds. Rectal:  right buttock reveals an area approximately 0.75 x 0.5 cm with denuded epithelium but no significant nodularity and no pigment change.  No signs of cellulitis. Extremities:  No clubbing, cyanosis, edema, or deformity noted with normal full range of motion of all joints.     Impression & Recommendations:  Problem # 1:  FUNGAL INFECTION (ICD-117.9) Probable fungal infection of the ear canal. He has what appear to be several blackened spores. We'll treat with VoSol  otic drops and recheck at followup. Keep ear canals as dry as possible  Problem # 2:  SKIN RASH (ICD-782.1)  Nonhealing area right buttock for 6 months. Schedule skin biopsy to further assess..  Complete Medication List: 1)  Avodart 0.5 Mg Caps (Dutasteride) .... Once daily 2)  Aspirin  Adult Low Strength 81 Mg Tbec (Aspirin ) .... Once daily 3)  Centrum Silver Ultra Mens Tabs (Multiple vitamins-minerals) .... Once daily 4)  Vosol  Hc 2-1 % Soln (Hydrocortisone -acetic acid ) .... 2 to 3 gtts left ear three times a day for 7 days  Patient Instructions: 1)  Schedule followup for skin biopsy in the next several weeks. Keep ear canals dry as possible Prescriptions: VOSOL  HC 2-1 % SOLN (HYDROCORTISONE -ACETIC  ACID) 2 to 3 gtts left ear three times a day for 7 days  #53ml x 1   Entered and Authorized by:   Wolm Scarlet MD   Signed by:   Wolm Scarlet MD on 01/19/2009   Method used:   Electronically to        [facility] [phone]* (retail)       [address]       [place], [place]  [address]       Ph: [phone] or [phone]       Fax: [phone]   RxID:   [id number]       Preventive Care Screening  Last Pneumovax:    Date:  10/02/2007    Results:  given   Last Tetanus Booster:    Date:  10/02/2007    Results:  Historical   Colonoscopy:    Date:  10/01/2005    Results:  normal

## 2010-12-21 LAB — CBC
HCT: 45 % (ref 39.0–52.0)
Hemoglobin: 15.4 g/dL (ref 13.0–17.0)
MCHC: 34.2 g/dL (ref 30.0–36.0)
MCV: 91.6 fL (ref 78.0–100.0)
Platelets: 154 K/uL (ref 150–400)
RBC: 4.91 MIL/uL (ref 4.22–5.81)
RDW: 13.5 % (ref 11.5–15.5)
WBC: 6.7 K/uL (ref 4.0–10.5)

## 2010-12-21 LAB — BASIC METABOLIC PANEL WITH GFR
BUN: 10 mg/dL (ref 6–23)
CO2: 31 meq/L (ref 19–32)
Calcium: 9.7 mg/dL (ref 8.4–10.5)
Chloride: 104 meq/L (ref 96–112)
Creatinine, Ser: 0.98 mg/dL (ref 0.4–1.5)
GFR calc Af Amer: >60 mL/min (ref >60)
GFR calc non Af Amer: >60 mL/min (ref >60)
Glucose, Bld: 105 mg/dL — ABNORMAL HIGH (ref 70–99)
Potassium: 4.1 meq/L (ref 3.5–5.1)
Sodium: 141 meq/L (ref 135–145)

## 2010-12-25 LAB — BASIC METABOLIC PANEL WITH GFR
BUN: 9 mg/dL (ref 6–23)
CO2: 25 meq/L (ref 19–32)
Calcium: 8.5 mg/dL (ref 8.4–10.5)
Chloride: 106 meq/L (ref 96–112)
Creatinine, Ser: 0.96 mg/dL (ref 0.4–1.5)
GFR calc Af Amer: >60 mL/min (ref >60)
GFR calc non Af Amer: >60 mL/min (ref >60)
Glucose, Bld: 143 mg/dL — ABNORMAL HIGH (ref 70–99)
Potassium: 4.2 meq/L (ref 3.5–5.1)
Sodium: 138 meq/L (ref 135–145)

## 2010-12-25 LAB — HEMOGLOBIN AND HEMATOCRIT, BLOOD
HCT: 35.1 % — ABNORMAL LOW (ref 39.0–52.0)
HCT: 38.4 % — ABNORMAL LOW (ref 39.0–52.0)
HCT: 43.5 % (ref 39.0–52.0)
Hemoglobin: 11.7 g/dL — ABNORMAL LOW (ref 13.0–17.0)
Hemoglobin: 12.4 g/dL — ABNORMAL LOW (ref 13.0–17.0)
Hemoglobin: 14 g/dL (ref 13.0–17.0)

## 2010-12-25 LAB — CARDIAC PANEL(CRET KIN+CKTOT+MB+TROPI)
CK, MB: 1.2 ng/mL (ref 0.3–4.0)
CK, MB: 1.3 ng/mL (ref 0.3–4.0)
Relative Index: INVALID (ref 0.0–2.5)
Relative Index: INVALID (ref 0.0–2.5)
Total CK: 40 U/L (ref 7–232)
Total CK: 83 U/L (ref 7–232)
Troponin I: <0.01 ng/mL (ref 0.00–0.06)
Troponin I: <0.01 ng/mL (ref 0.00–0.06)

## 2010-12-25 LAB — TYPE AND SCREEN
ABO/RH(D): O POS
Antibody Screen: NEGATIVE

## 2010-12-25 LAB — ABO/RH: ABO/RH(D): O POS

## 2011-02-02 ENCOUNTER — Encounter: Payer: Self-pay | Admitting: Cardiovascular Disease

## 2011-02-02 ENCOUNTER — Other Ambulatory Visit: Payer: Self-pay | Admitting: Cardiovascular Disease

## 2011-02-02 DIAGNOSIS — I501 Left ventricular failure: Secondary | ICD-10-CM

## 2011-02-07 ENCOUNTER — Other Ambulatory Visit (HOSPITAL_COMMUNITY): Payer: Self-pay

## 2011-02-12 ENCOUNTER — Other Ambulatory Visit (HOSPITAL_COMMUNITY): Payer: Self-pay | Admitting: Radiology

## 2011-02-12 ENCOUNTER — Ambulatory Visit (HOSPITAL_COMMUNITY): Payer: Medicare Other | Attending: Cardiovascular Disease | Admitting: Radiology

## 2011-02-12 DIAGNOSIS — I501 Left ventricular failure: Secondary | ICD-10-CM | POA: Insufficient documentation

## 2011-02-12 DIAGNOSIS — I428 Other cardiomyopathies: Secondary | ICD-10-CM

## 2011-02-16 ENCOUNTER — Telehealth: Payer: Self-pay | Admitting: Cardiovascular Disease

## 2011-02-16 NOTE — Telephone Encounter (Signed)
Pt given results of ECHO--nt

## 2011-02-16 NOTE — Telephone Encounter (Signed)
Pt calling re echo test results.

## 2011-03-08 ENCOUNTER — Ambulatory Visit: Payer: Medicare Other | Admitting: Family Medicine

## 2011-04-24 ENCOUNTER — Telehealth: Payer: Self-pay | Admitting: Cardiovascular Disease

## 2011-04-24 NOTE — Telephone Encounter (Signed)
Pt has questions regarding echo

## 2011-04-25 NOTE — Telephone Encounter (Signed)
Per pt call, pt said he missed returned call yesterday regarding ECHO. Please return pt call to advise/discuss.

## 2011-04-25 NOTE — Telephone Encounter (Signed)
Spoke with pt, questions regarding echo answered Mark Pruitt

## 2011-04-27 ENCOUNTER — Telehealth: Payer: Self-pay | Admitting: Cardiovascular Disease

## 2011-04-27 NOTE — Telephone Encounter (Signed)
Patient had talked with Mark Pruitt on his last office visit , regarding his insurance refusing to pay for an echo, because of a diagnosis of Cardiomyopathy which he  knows he  Doe not have. Pt said nurse offered to write a letter to his insurance, whish he refused at the time. Now pt. Would like for letter to be send to" [name] at [facility] [address] [place]. [address] [place] [address], or fax it to fax # [phone].

## 2011-04-27 NOTE — Telephone Encounter (Signed)
Per pt call, pt would like to speak w/ nurse again about ECHO. Pt said nurse offered to write him a letter to insurance which pt declined. Pt is interested in having a letter written to insurance. Please return pt call to advise/discuss.

## 2011-05-07 NOTE — Telephone Encounter (Signed)
Per pt call, pt called last week or so about wanting nurse to write a letter to pt insurance, which pt previously had refused. Pt said letter has still not been sent to pt insurance. Please return pt call to advise/discuss.  Pt was informed this message was sent around high priority.

## 2011-05-09 ENCOUNTER — Encounter: Payer: Self-pay | Admitting: *Deleted

## 2011-05-09 NOTE — Telephone Encounter (Signed)
Letter faxed to the number provided and also mailed Deliah Goody

## 2011-08-02 ENCOUNTER — Other Ambulatory Visit: Payer: Self-pay | Admitting: Cardiovascular Disease

## 2011-11-14 ENCOUNTER — Other Ambulatory Visit (INDEPENDENT_AMBULATORY_CARE_PROVIDER_SITE_OTHER): Payer: Medicare Other

## 2011-11-14 DIAGNOSIS — Z125 Encounter for screening for malignant neoplasm of prostate: Secondary | ICD-10-CM

## 2011-11-14 DIAGNOSIS — Z79899 Other long term (current) drug therapy: Secondary | ICD-10-CM

## 2011-11-14 DIAGNOSIS — Z0000 Encounter for general adult medical examination without abnormal findings: Secondary | ICD-10-CM

## 2011-11-14 LAB — POCT URINALYSIS DIPSTICK
Bilirubin, UA: NEGATIVE
Blood, UA: NEGATIVE
Glucose, UA: NEGATIVE
Ketones, UA: NEGATIVE
Leukocytes, UA: NEGATIVE
Nitrite, UA: NEGATIVE
Protein, UA: NEGATIVE
Spec Grav, UA: 1.02
Urobilinogen, UA: 0.2
pH, UA: 5.5

## 2011-11-14 LAB — BASIC METABOLIC PANEL WITH GFR
BUN: 16 mg/dL (ref 6–23)
CO2: 28 meq/L (ref 19–32)
Calcium: 9.5 mg/dL (ref 8.4–10.5)
Chloride: 107 meq/L (ref 96–112)
Creatinine, Ser: 1 mg/dL (ref 0.4–1.5)
GFR: 78.01 mL/min (ref >60.00)
Glucose, Bld: 88 mg/dL (ref 70–99)
Potassium: 5.4 meq/L — ABNORMAL HIGH (ref 3.5–5.1)
Sodium: 142 meq/L (ref 135–145)

## 2011-11-14 LAB — CBC WITH DIFFERENTIAL/PLATELET
Basophils Absolute: 0 K/uL (ref 0.0–0.1)
Basophils Relative: 0.6 % (ref 0.0–3.0)
Eosinophils Absolute: 0.1 K/uL (ref 0.0–0.7)
Eosinophils Relative: 1.6 % (ref 0.0–5.0)
HCT: 45.6 % (ref 39.0–52.0)
Hemoglobin: 15.2 g/dL (ref 13.0–17.0)
Lymphocytes Relative: 25.8 % (ref 12.0–46.0)
Lymphs Abs: 1.6 K/uL (ref 0.7–4.0)
MCHC: 33.3 g/dL (ref 30.0–36.0)
MCV: 91.4 fl (ref 78.0–100.0)
Monocytes Absolute: 0.6 K/uL (ref 0.1–1.0)
Monocytes Relative: 9.8 % (ref 3.0–12.0)
Neutro Abs: 3.8 K/uL (ref 1.4–7.7)
Neutrophils Relative %: 62.2 % (ref 43.0–77.0)
Platelets: 152 K/uL (ref 150.0–400.0)
RBC: 4.98 Mil/uL (ref 4.22–5.81)
RDW: 14.1 % (ref 11.5–14.6)
WBC: 6.1 K/uL (ref 4.5–10.5)

## 2011-11-14 LAB — HEPATIC FUNCTION PANEL
ALT: 11 U/L (ref 0–53)
AST: 18 U/L (ref 0–37)
Albumin: 3.8 g/dL (ref 3.5–5.2)
Alkaline Phosphatase: 42 U/L (ref 39–117)
Bilirubin, Direct: 0.2 mg/dL (ref 0.0–0.3)
Total Bilirubin: 1.4 mg/dL — ABNORMAL HIGH (ref 0.3–1.2)
Total Protein: 6.6 g/dL (ref 6.0–8.3)

## 2011-11-14 LAB — LIPID PANEL
Cholesterol: 170 mg/dL (ref 0–200)
HDL: 57.1 mg/dL (ref >39.00)
LDL Cholesterol: 99 mg/dL (ref 0–99)
Total CHOL/HDL Ratio: 3
Triglycerides: 69 mg/dL (ref 0.0–149.0)
VLDL: 13.8 mg/dL (ref 0.0–40.0)

## 2011-11-14 LAB — TSH: TSH: 1.18 u[IU]/mL (ref 0.35–5.50)

## 2011-11-14 LAB — PSA: PSA: 0 ng/mL — ABNORMAL LOW (ref 0.10–4.00)

## 2011-11-15 ENCOUNTER — Encounter: Payer: Self-pay | Admitting: Family Medicine

## 2011-11-21 ENCOUNTER — Ambulatory Visit: Payer: Medicare Other | Admitting: Family Medicine

## 2011-11-22 ENCOUNTER — Ambulatory Visit (INDEPENDENT_AMBULATORY_CARE_PROVIDER_SITE_OTHER)
Admission: RE | Admit: 2011-11-22 | Discharge: 2011-11-22 | Disposition: A | Discharge status: Home / Self Care | Payer: Medicare Other | Source: Ambulatory Visit | Attending: Family Medicine | Admitting: Family Medicine

## 2011-11-22 ENCOUNTER — Ambulatory Visit (INDEPENDENT_AMBULATORY_CARE_PROVIDER_SITE_OTHER): Payer: Medicare Other | Admitting: Family Medicine

## 2011-11-22 ENCOUNTER — Encounter: Payer: Self-pay | Admitting: Family Medicine

## 2011-11-22 VITALS — BP 98/64 | HR 72 | Temp 98.1°F | Resp 12 | Ht 71.5 in | Wt 188.0 lb

## 2011-11-22 DIAGNOSIS — C61 Malignant neoplasm of prostate: Secondary | ICD-10-CM | POA: Insufficient documentation

## 2011-11-22 DIAGNOSIS — M79609 Pain in unspecified limb: Secondary | ICD-10-CM

## 2011-11-22 DIAGNOSIS — M79672 Pain in left foot: Secondary | ICD-10-CM

## 2011-11-22 DIAGNOSIS — Z0000 Encounter for general adult medical examination without abnormal findings: Secondary | ICD-10-CM

## 2011-11-22 NOTE — Progress Notes (Signed)
Subjective:    Patient ID: Mark Pruitt, male    DOB: August 25, 1941, 71 y.o.   MRN: [id number]  HPI  Complete physical. Patient has history of colon polyps, prostate cancer, previous cardiomyopathy with ejection fracture of 45% and improved on subsequent echo last year. He is followed by urologist. Medications include lisinopril 5 mg daily aspirin 81 mg daily and several supplements.  No dyspnea or chest pain. No hx of CAD.  Patient had colonoscopy 2010. Immunizations are up to date. No regular exercise.  New problem for evaluation which is left foot pain. Location is MTP joint. He has never noted any warmth or erythema. No history of gout.  No injury. Pain is almost daily. Worse with ambulation. He not take any regular medications. No history of injury. No ankle pain.  Past Medical History  Diagnosis Date  . COLONIC POLYPS 07/25/2009  . ELEVATED PROSTATE SPECIFIC ANTIGEN 07/27/2009  . HYPERTROPHY PROSTATE W/UR OBST & OTH LUTS 07/25/2009  . LICHEN SIMPLEX CHRONICUS 02/03/2009  . Other primary cardiomyopathies 08/08/2010   Past Surgical History  Procedure Date  . Prostate surgery     reports that he quit smoking about 28 years ago. His smoking use included Cigarettes. He has a 20 pack-year smoking history. He does not have any smokeless tobacco history on file. His alcohol and drug histories not on file. family history includes Anuerysm (age of onset:70) in his father and Cancer in his mother.  There is no history of Arthritis. No Known Allergies   Review of Systems  Constitutional: Negative for fever, activity change, appetite change, fatigue and unexpected weight change.  HENT: Negative for ear pain, congestion and trouble swallowing.   Eyes: Negative for pain and visual disturbance.  Respiratory: Negative for cough, shortness of breath and wheezing.   Cardiovascular: Negative for chest pain and palpitations.  Gastrointestinal: Negative for nausea, vomiting, abdominal pain,  diarrhea, constipation, blood in stool, abdominal distention and rectal pain.  Genitourinary: Negative for dysuria, hematuria and testicular pain.  Musculoskeletal: Negative for joint swelling and arthralgias.  Skin: Negative for rash.  Neurological: Negative for dizziness, syncope and headaches.  Hematological: Negative for adenopathy.  Psychiatric/Behavioral: Negative for confusion and dysphoric mood.       Objective:   Physical Exam  Constitutional: He is oriented to person, place, and time. He appears well-developed and well-nourished. No distress.  HENT:  Head: Normocephalic and atraumatic.  Right Ear: External ear normal.  Left Ear: External ear normal.  Mouth/Throat: Oropharynx is clear and moist.  Eyes: Conjunctivae and EOM are normal. Pupils are equal, round, and reactive to light.  Neck: Normal range of motion. Neck supple. No thyromegaly present.  Cardiovascular: Normal rate, regular rhythm and normal heart sounds.   No murmur heard. Pulmonary/Chest: No respiratory distress. He has no wheezes. He has no rales.  Abdominal: Soft. Bowel sounds are normal. He exhibits no distension and no mass. There is no tenderness. There is no rebound and no guarding.  Musculoskeletal: He exhibits no edema.       Left foot metatarsophalangeal joint reveals some mild tenderness. No erythema. No palpable abnormalities  Lymphadenopathy:    He has no cervical adenopathy.  Neurological: He is alert and oriented to person, place, and time. He displays normal reflexes. No cranial nerve deficit.  Skin: No rash noted.  Psychiatric: He has a normal mood and affect.          Assessment & Plan:  #1 complete physical. Immunizations up-to-date. Colonoscopy up to date.  Discussed consistent exercise. Labs reviewed with patient. #2 left foot pain. Doubt gout.  More likely osteoarthritis. Progressive pain over 2 years. Plain x-rays to further assess. He is reluctant to try medications at this time.   Discussed appropriate footwear.

## 2011-11-23 ENCOUNTER — Ambulatory Visit: Payer: Self-pay | Admitting: Family Medicine

## 2011-11-23 ENCOUNTER — Encounter: Payer: Self-pay | Admitting: Family Medicine

## 2011-11-23 NOTE — Progress Notes (Signed)
Quick Note:  Pt aware ______ 

## 2012-03-11 ENCOUNTER — Telehealth: Payer: Self-pay | Admitting: Family Medicine

## 2012-03-11 MED ORDER — TRIAMCINOLONE ACETONIDE 0.1 % EX CREA: TOPICAL_CREAM | Freq: Two times a day (BID) | CUTANEOUS | Status: AC

## 2012-03-11 NOTE — Telephone Encounter (Signed)
Pt requesting refill on triamcinolone cream (KENALOG) 0.1 %  [facility]

## 2012-05-23 ENCOUNTER — Encounter: Payer: Self-pay | Admitting: Family Medicine

## 2012-05-23 ENCOUNTER — Telehealth: Payer: Self-pay | Admitting: Family Medicine

## 2012-05-23 ENCOUNTER — Ambulatory Visit (INDEPENDENT_AMBULATORY_CARE_PROVIDER_SITE_OTHER): Payer: Medicare Other | Admitting: Family Medicine

## 2012-05-23 ENCOUNTER — Ambulatory Visit: Payer: Medicare Other | Admitting: Family Medicine

## 2012-05-23 VITALS — BP 110/80 | Temp 97.9°F | Wt 190.0 lb

## 2012-05-23 DIAGNOSIS — L259 Unspecified contact dermatitis, unspecified cause: Secondary | ICD-10-CM

## 2012-05-23 MED ORDER — PREDNISONE 10 MG PO TABS: ORAL_TABLET | ORAL | Status: DC

## 2012-05-23 NOTE — Telephone Encounter (Signed)
Appt made for 1:15 - pt aware.

## 2012-05-23 NOTE — Telephone Encounter (Signed)
Caller: [name]/Patient; Patient Name: Mark Pruitt; PCP: Evelena Peat; Best Callback Phone Number: [phone]; Pt is calling and states that he has poison ivy or poison oak; symptoms started 05/21/12; rash is located on both arms; left top eyelid and neck area; using hydrocortisone cream and helps the itching;  Triaged per Posion Ivy, Oak, or [place] Exposure Guideline; See in 4 hours due to involves eyes; pt is requesting appointment prior to 1:45pm today due to need to pick up grandchildren; no appointments available until later this afternoon; OFFICE PLEASE REVIEW DUE TO A SEE IN 4 HOUR DISPOSITION AND PATIENT NEEDS AN APPOINTMENT PRIOR TO 1:45PM TODAY DUE TO CONFLICT IN HIS SCHEDULE;  instructed pt that he will receive a call back regarding a possible earlier appointment; pt will see the first available physician;

## 2012-05-23 NOTE — Progress Notes (Signed)
  Subjective:    Patient ID: Mark Pruitt, male    DOB: 04-11-1941, 71 y.o.   MRN: [id number]  HPI  Patient seen pruritic rash upper extremities and left upper lid. Did lots of yard work last week. This started several days ago. Tried hydrocortisone cream without improvement. He has moderate pruritus. No pain. No fever or chills. Exacerbated by heat.   Review of Systems  Constitutional: Negative for fever and chills.  Skin: Positive for rash.       Objective:   Physical Exam  Constitutional: He appears well-developed and well-nourished.  Skin:       Patient has erythematous rash with fairly large areas of distribution both forearms. Nontender. Nonscaly. Minimally raised. Minimally vesicular couple of areas          Assessment & Plan:  Contact dermatitis. Discussed options. Oral prednisone taper over several days. Followup as needed. Reviewed possible side effects from prednisone

## 2012-05-23 NOTE — Patient Instructions (Addendum)

## 2012-05-28 ENCOUNTER — Other Ambulatory Visit: Payer: Self-pay | Admitting: Family Medicine

## 2012-05-28 MED ORDER — SCOPOLAMINE 1 MG/3DAYS TD PT72: 1.0000 | MEDICATED_PATCH | TRANSDERMAL | Status: AC

## 2012-05-28 NOTE — Telephone Encounter (Signed)
Scopolamine patch one patch behind the ear every 3 days

## 2012-05-28 NOTE — Telephone Encounter (Addendum)
Pt would like sea sickness patches for 10 day cruise call into [facility] [phone]. Pt will be leaving on Sunday.

## 2012-08-04 ENCOUNTER — Ambulatory Visit (INDEPENDENT_AMBULATORY_CARE_PROVIDER_SITE_OTHER): Payer: Medicare Other | Admitting: Family Medicine

## 2012-08-04 DIAGNOSIS — Z23 Encounter for immunization: Secondary | ICD-10-CM

## 2012-09-02 ENCOUNTER — Other Ambulatory Visit: Payer: Self-pay | Admitting: Cardiovascular Disease

## 2012-09-03 MED ORDER — LISINOPRIL 5 MG PO TABS: ORAL_TABLET | ORAL | Status: DC

## 2013-06-15 ENCOUNTER — Ambulatory Visit (INDEPENDENT_AMBULATORY_CARE_PROVIDER_SITE_OTHER): Payer: Medicare Other | Admitting: Family Medicine

## 2013-06-15 ENCOUNTER — Encounter: Payer: Self-pay | Admitting: Family Medicine

## 2013-06-15 VITALS — BP 122/78 | HR 70 | Temp 97.6°F | Ht 71.0 in | Wt 192.0 lb

## 2013-06-15 DIAGNOSIS — Z23 Encounter for immunization: Secondary | ICD-10-CM

## 2013-06-15 DIAGNOSIS — H9209 Otalgia, unspecified ear: Secondary | ICD-10-CM

## 2013-06-15 DIAGNOSIS — J029 Acute pharyngitis, unspecified: Secondary | ICD-10-CM

## 2013-06-15 DIAGNOSIS — H9201 Otalgia, right ear: Secondary | ICD-10-CM

## 2013-06-15 LAB — POCT RAPID STREP A (OFFICE): Rapid Strep A Screen: NEGATIVE

## 2013-06-15 MED ORDER — HYDROCORTISONE-ACETIC ACID 1-2 % OT SOLN: 3.0000 [drp] | Freq: Three times a day (TID) | OTIC | Status: DC

## 2013-06-15 NOTE — Progress Notes (Signed)
  Subjective:    Patient ID: Mark Pruitt, male    DOB: 12-10-1940, 72 y.o.   MRN: [id number]  HPI Patient seen as a work in He's had some intermittent right ear pain for the past several days. Also had intermittent sore throat for 6 weeks. Soreness is more on the left side. He has some salt water gargles briefly which helped slightly. Denies any fever or chills. He has bilateral hearing aids and thinks some his right ear soreness may be due to his hearing aids rubbing against ear canal. He has a remote history of fungal infection of the right external ear and noticed some black discolored drainage couple days ago similar to previous fungal infection. No acute hearing change.  Patient requesting flu vaccine today  Past Medical History  Diagnosis Date  . COLONIC POLYPS 07/25/2009  . ELEVATED PROSTATE SPECIFIC ANTIGEN 07/27/2009  . HYPERTROPHY PROSTATE W/UR OBST & OTH LUTS 07/25/2009  . LICHEN SIMPLEX CHRONICUS 02/03/2009  . Other primary cardiomyopathies 08/08/2010   Past Surgical History  Procedure Laterality Date  . Prostate surgery      reports that he quit smoking about 29 years ago. His smoking use included Cigarettes. He has a 20 pack-year smoking history. He does not have any smokeless tobacco history on file. His alcohol and drug histories are not on file. family history includes Anuerysm (age of onset: 50) in his father; Cancer in his mother. There is no history of Arthritis. No Known Allergies    Review of Systems  Constitutional: Negative for fever and chills.  HENT: Positive for ear pain and sore throat. Negative for trouble swallowing, voice change and tinnitus.   Respiratory: Negative for shortness of breath.   Cardiovascular: Negative for chest pain.       Objective:   Physical Exam  Constitutional: He appears well-developed and well-nourished.  HENT:  Minimal right external canal redness which is likely related to hearing aid rubbing against ear canal.   eardrum appears normal.   Few ?dark spores deep in ear canal.  Oropharynx reveals no lesions. No exudate. No posterior pharynx asymmetry.  Neck: Neck supple. No thyromegaly present.  Lymphadenopathy:    He has no cervical adenopathy.          Assessment & Plan:  #1 right ear drainage. Previous history of fungal infection and he again noticed some black and spores recently. Start VoSol HC drops 3-4 times daily. Keep dry as much as possible  #2 sore throat with normal exam. Rapid strep negative. Try saltwater gargles 2-3 times daily next couple weeks. Consider ENT referral for better 2 weeks . nonfocal exam and no red flags such as adenopathy, weight loss, etc. #3 health maintenance. Flu vaccine given

## 2013-06-15 NOTE — Patient Instructions (Addendum)
Salt water gargles several times daily Touch base in 2 weeks if no better.

## 2013-06-25 ENCOUNTER — Telehealth: Payer: Self-pay | Admitting: Family Medicine

## 2013-06-25 DIAGNOSIS — J312 Chronic pharyngitis: Secondary | ICD-10-CM

## 2013-06-25 NOTE — Telephone Encounter (Signed)
I would set him up to see Dr Narda Bonds (ENT) Dx:  Chronic pharyngitis

## 2013-06-25 NOTE — Telephone Encounter (Signed)
Pt stated that his throat is still sore to swallow food/drinks

## 2013-06-25 NOTE — Telephone Encounter (Signed)
Pt instructed to call back if throat has no improvement for referral to throat spec. Pt states its been 6-8 wks w/ this sore throat

## 2013-06-26 NOTE — Telephone Encounter (Signed)
Order has been placed.

## 2013-08-17 ENCOUNTER — Other Ambulatory Visit: Payer: Self-pay | Admitting: Cardiovascular Disease

## 2013-08-18 ENCOUNTER — Other Ambulatory Visit: Payer: Self-pay

## 2013-08-18 MED ORDER — LISINOPRIL 5 MG PO TABS: ORAL_TABLET | ORAL | Status: DC

## 2013-08-31 ENCOUNTER — Encounter: Payer: Self-pay | Admitting: Family Medicine

## 2013-08-31 ENCOUNTER — Ambulatory Visit (INDEPENDENT_AMBULATORY_CARE_PROVIDER_SITE_OTHER): Payer: Medicare Other | Admitting: Family Medicine

## 2013-08-31 ENCOUNTER — Ambulatory Visit (INDEPENDENT_AMBULATORY_CARE_PROVIDER_SITE_OTHER)
Admission: RE | Admit: 2013-08-31 | Discharge: 2013-08-31 | Disposition: A | Discharge status: Home / Self Care | Payer: Medicare Other | Source: Ambulatory Visit | Attending: Family Medicine | Admitting: Family Medicine

## 2013-08-31 VITALS — BP 100/62 | HR 66 | Temp 97.9°F | Wt 197.0 lb

## 2013-08-31 DIAGNOSIS — M79671 Pain in right foot: Secondary | ICD-10-CM

## 2013-08-31 DIAGNOSIS — M79609 Pain in unspecified limb: Secondary | ICD-10-CM

## 2013-08-31 NOTE — Progress Notes (Signed)
   Subjective:    Patient ID: Mark Pruitt, male    DOB: August 18, 1941, 72 y.o.   MRN: [id number]  HPI Patient seen with right foot injury 2 days ago pain giving he dropped a leaf from a table on his right foot. He was able to walk afterwards but had some soreness over the fourth metatarsal. He noticed extensive bruising about 2 days later. Denies any other injury. Pain is mild to moderate. No pain at rest.  Past Medical History  Diagnosis Date  . COLONIC POLYPS 07/25/2009  . ELEVATED PROSTATE SPECIFIC ANTIGEN 07/27/2009  . HYPERTROPHY PROSTATE W/UR OBST & OTH LUTS 07/25/2009  . LICHEN SIMPLEX CHRONICUS 02/03/2009  . Other primary cardiomyopathies 08/08/2010   Past Surgical History  Procedure Laterality Date  . Prostate surgery      reports that he quit smoking about 29 years ago. His smoking use included Cigarettes. He has a 20 pack-year smoking history. He does not have any smokeless tobacco history on file. His alcohol and drug histories are not on file. family history includes Anuerysm (age of onset: 44) in his father; Cancer in his mother. There is no history of Arthritis. No Known Allergies    Review of Systems  Neurological: Negative for weakness and numbness.  Hematological: Does not bruise/bleed easily.       Objective:   Physical Exam  Constitutional: He appears well-developed and well-nourished.  Cardiovascular: Normal rate.   Pulmonary/Chest: Effort normal. No respiratory distress. He has no wheezes. He has no rales.  Musculoskeletal:  Right foot reveals extensive ecchymosis involving the second, third and fourth toes and also along the lateral aspect of the right foot. Tenderness over the proximal to mid aspect of the fourth metatarsal.          Assessment & Plan:  Right foot pain. Contusion and rule out fourth metatarsal fracture. Obtain x-rays. Patient is currently ambulating without difficulty

## 2013-08-31 NOTE — Progress Notes (Signed)
Pre visit review using our clinic review tool, if applicable. No additional management support is needed unless otherwise documented below in the visit note. 

## 2013-09-03 ENCOUNTER — Telehealth: Payer: Self-pay | Admitting: Family Medicine

## 2013-09-03 MED ORDER — SCOPOLAMINE 1 MG/3DAYS TD PT72: 1.0000 | MEDICATED_PATCH | TRANSDERMAL | Status: DC

## 2013-09-03 NOTE — Telephone Encounter (Signed)
Sent RX for patches to pharmacy.

## 2013-09-03 NOTE — Telephone Encounter (Signed)
Pt is requesting a new rx for 5 seasickness patches if possible. Pt thought he had a few at home but he does not, pt and spouse are leaving Saturday and would like to pick up today if not tomorrow. Please advise.

## 2013-10-12 ENCOUNTER — Encounter: Payer: Self-pay | Admitting: Family Medicine

## 2013-10-12 ENCOUNTER — Ambulatory Visit (INDEPENDENT_AMBULATORY_CARE_PROVIDER_SITE_OTHER): Payer: Commercial Managed Care - HMO | Admitting: Family Medicine

## 2013-10-12 ENCOUNTER — Ambulatory Visit (INDEPENDENT_AMBULATORY_CARE_PROVIDER_SITE_OTHER)
Admission: RE | Admit: 2013-10-12 | Discharge: 2013-10-12 | Disposition: A | Discharge status: Home / Self Care | Payer: Medicare HMO | Source: Ambulatory Visit | Attending: Family Medicine | Admitting: Family Medicine

## 2013-10-12 VITALS — BP 130/80 | HR 86 | Temp 97.7°F | Wt 200.0 lb

## 2013-10-12 DIAGNOSIS — S93409A Sprain of unspecified ligament of unspecified ankle, initial encounter: Secondary | ICD-10-CM

## 2013-10-12 DIAGNOSIS — S93402A Sprain of unspecified ligament of left ankle, initial encounter: Secondary | ICD-10-CM

## 2013-10-12 NOTE — Progress Notes (Signed)
   Subjective:    Patient ID: Mark Pruitt, male    DOB: [birth year], 73 y.o.   MRN: [id number]  HPI Acute visit Left ankle injury. Occurred yesterday at church. Got up to stand and inversion type injury. Felt slightly unstable afterwards. Was able to bear weight but does have some pain mostly lateral. He did have some edema right away. Minimal bruising. Has been elevating and icing. No prior history of ankle injury  Past Medical History  Diagnosis Date  . COLONIC POLYPS 07/25/2009  . ELEVATED PROSTATE SPECIFIC ANTIGEN 07/27/2009  . HYPERTROPHY PROSTATE W/UR OBST & OTH LUTS 07/25/2009  . LICHEN SIMPLEX CHRONICUS 02/03/2009  . Other primary cardiomyopathies 08/08/2010   Past Surgical History  Procedure Laterality Date  . Prostate surgery      reports that he quit smoking about 29 years ago. His smoking use included Cigarettes. He has a 20 pack-year smoking history. He does not have any smokeless tobacco history on file. His alcohol and drug histories are not on file. family history includes Anuerysm (age of onset: 21) in his father; Cancer in his mother. There is no history of Arthritis. No Known Allergies    Review of Systems  Neurological: Negative for weakness and numbness.       Objective:   Physical Exam  Constitutional: He appears well-developed and well-nourished.  Cardiovascular: Normal rate.   Pulmonary/Chest: Effort normal and breath sounds normal. No respiratory distress. He has no wheezes. He has no rales.  Musculoskeletal:  Left ankle reveals some edema mostly laterally. He has full range of motion. Tenderness of the distal fibula but no tibial tenderness. He has pain with inversion left ankle and inversion. Minimal ecchymosis inferior to the lateral malleolus. Achilles is intact and nontender          Assessment & Plan:  Acute left ankle injury. Probable sprain. Rule out distal fibular fracture. X-rays obtained. Continue ice, elevation, and  compression.

## 2013-10-12 NOTE — Patient Instructions (Signed)

## 2013-10-12 NOTE — Progress Notes (Signed)
Pre visit review using our clinic review tool, if applicable. No additional management support is needed unless otherwise documented below in the visit note. 

## 2013-10-22 ENCOUNTER — Telehealth: Payer: Self-pay | Admitting: Family Medicine

## 2013-10-22 NOTE — Telephone Encounter (Signed)
Pt needs referral to dr Johnsie Cancel. (First appt was for irregular heartbeat yrs ago), pt not seen since 2011 by dr Kyla Balzarine office. Pt needing med refill. Pt has appt 11/17/13 [employer]

## 2013-10-23 MED ORDER — LISINOPRIL 5 MG PO TABS: ORAL_TABLET | ORAL | Status: DC

## 2013-10-23 NOTE — Telephone Encounter (Signed)
Please see previous part of this note, and create referral in epic.

## 2013-10-23 NOTE — Telephone Encounter (Signed)
Is it okay to put in referral? °

## 2013-10-23 NOTE — Telephone Encounter (Signed)
Pt was referring to his lisinopril. [facility]

## 2013-10-23 NOTE — Telephone Encounter (Signed)
If he is referring to the lisinopril, we can refill that.  I would not see need to see cardiologist for that unless he has any new chest symptoms.

## 2013-11-17 ENCOUNTER — Ambulatory Visit: Payer: Commercial Managed Care - HMO | Admitting: Cardiovascular Disease

## 2014-01-05 ENCOUNTER — Encounter: Payer: Self-pay | Admitting: Internal Medicine

## 2014-01-05 ENCOUNTER — Ambulatory Visit (INDEPENDENT_AMBULATORY_CARE_PROVIDER_SITE_OTHER): Payer: Commercial Managed Care - HMO | Admitting: Internal Medicine

## 2014-01-05 VITALS — BP 116/62 | Temp 98.0°F | Wt 194.0 lb

## 2014-01-05 DIAGNOSIS — R141 Gas pain: Secondary | ICD-10-CM | POA: Diagnosis not present

## 2014-01-05 DIAGNOSIS — R140 Abdominal distension (gaseous): Secondary | ICD-10-CM

## 2014-01-05 DIAGNOSIS — R142 Eructation: Secondary | ICD-10-CM

## 2014-01-05 DIAGNOSIS — R143 Flatulence: Secondary | ICD-10-CM

## 2014-01-05 NOTE — Progress Notes (Signed)
Chief Complaint  Patient presents with  . Bloated    States his stomach feels hard and bloated.  Started last week.  . Burping    HPI: Patient comes in today for SDA for  new problem evaluation. PCP not  In office til tomorrow.   Onset of nausea and gas about 5-6 days ago  Saturday days a go   Had diarrhea 1 day only  Briefly   Feels ongoing bloating and "nothing leaves stomach" and gas burping .sometimes has to sit upright to burp some acid feeling  No epigastric pain v change in bowels at this time   No sigpain but "lots of gurgles ."  acidy feeling  Tired  maalox.   ROS: See pertinent positives and negatives per HPI.  Remote hxof ulcer 30 years  Ago. Clinical dx   Cruise [place]  .  No other  Sx city water.  No one else sick   hx of polyps   Was every 3 years and now 5 years  May be due fo colonsocopy  No etoh tobacco some caffiene  Past Medical History  Diagnosis Date  . COLONIC POLYPS 07/25/2009  . ELEVATED PROSTATE SPECIFIC ANTIGEN 07/27/2009  . HYPERTROPHY PROSTATE W/UR OBST & OTH LUTS 07/25/2009  . LICHEN SIMPLEX CHRONICUS 02/03/2009  . Other primary cardiomyopathies 08/08/2010    Family History  Problem Relation Age of Onset  . Arthritis Neg Hx     family hx  . Cancer Mother     parathyroid adenoma  . Anuerysm Father 86    abdominal    History   Social History  . Marital Status: Married    Spouse Name: N/A    Number of Children: N/A  . Years of Education: N/A   Social History Main Topics  . Smoking status: Former Smoker -- 1.00 packs/day for 20 years    Types: Cigarettes    Quit date: 10/22/1983  . Smokeless tobacco: None  . Alcohol Use: None  . Drug Use: None  . Sexual Activity: None   Other Topics Concern  . None   Social History Narrative   From [place] originally     Outpatient Encounter Prescriptions as of 01/05/2014  Medication Sig  . aspirin 81 MG tablet Take 81 mg by mouth daily.  Marland Kitchen Bioflavonoid Products  (ESTER C PO) Take by mouth daily.  . calcium citrate (CALCITRATE - DOSED IN MG ELEMENTAL CALCIUM) 950 MG tablet Take 1 tablet by mouth daily.  . Flaxseed, Linseed, (EQL FLAX SEED OIL) 1000 MG CAPS Take by mouth daily.  Marland Kitchen lisinopril (PRINIVIL,ZESTRIL) 5 MG tablet take 1 tablet by mouth once daily  . Multiple Vitamin (MULTIVITAMIN) tablet Take 1 tablet by mouth daily.  Marland Kitchen scopolamine (TRANSDERM-SCOP) 1.5 MG Place 1 patch (1.5 mg total) onto the skin every 3 (three) days.    EXAM:  BP 116/62  Temp(Src) 98 F (36.7 C) (Oral)  Wt 194 lb (87.998 kg)  Body mass index is 27.07 kg/(m^2).  GENERAL: vitals reviewed and listed above, alert, oriented, appears well hydrated and in no acute distress HEENT: atraumatic, conjunctiva  clear, no obvious abnormalities on inspection of external nose and ears hearing aidNECK: no obvious masses on inspection palpation  LUNGS: clear to auscultation bilaterally, no wheezes, rales or rhonchi, good air movement CV: HRRR, no clubbing cyanosis  nl cap refill  MS: moves all extremities without noticeable focal  Abnormality Abdomen:  Sof,t normal bowel sounds without hepatosplenomegaly, no guarding  rebound or masses no CVA tenderness no bruit noted  Skin fading bruise rught lower chest non tender   Left abd   Quarter sized  PSYCH: pleasant and cooperative, no obvious depression or anxiety  ASSESSMENT AND PLAN:  Discussed the following assessment and plan:  Abdominal bloating - upper abd sx like satiety and burping  redcent onset no alarm feaure otherwis on exam close fu warranted  Less than a week of sx new onset fairly abrupt   No fever? Infectious process ? Advise follow  add ppi  Fu with pcp consider [place] if persists    ?Due for colonoscopy ? This year for adenomatous polyps?  Uncertain what to make of bruise   Not really tender says was working some and could have hit the area  Fu if more ocurs  -Patient advised to return or notify health care team  if symptoms  worsen ,persist or new concerns arise.  Patient Instructions  Take Prilosec/ omeprazole every day for the next 2 weeks . Stop the aspirin temporarily. Make a follow up appt  with dr Elease Hashimoto  For 2-3 weeks . Contact office if worse in meantime. Consider getting ultrasound test of abdomen and or consulting with the GI department.  Your  Abdominal exam  is good today .     Standley Brooking. Shanee Batch M.D.  Pre visit review using our clinic review tool, if applicable. No additional management support is needed unless otherwise documented below in the visit note.

## 2014-01-05 NOTE — Patient Instructions (Signed)
Take Prilosec/ omeprazole every day for the next 2 weeks . Stop the aspirin temporarily. Make a follow up appt  with dr Elease Hashimoto  For 2-3 weeks . Contact office if worse in meantime. Consider getting ultrasound test of abdomen and or consulting with the GI department.  Your  Abdominal exam  is good today .

## 2014-01-26 ENCOUNTER — Ambulatory Visit (INDEPENDENT_AMBULATORY_CARE_PROVIDER_SITE_OTHER): Payer: Commercial Managed Care - HMO | Admitting: Family Medicine

## 2014-01-26 ENCOUNTER — Encounter: Payer: Self-pay | Admitting: Family Medicine

## 2014-01-26 VITALS — BP 130/68 | HR 94 | Wt 194.0 lb

## 2014-01-26 DIAGNOSIS — R1084 Generalized abdominal pain: Secondary | ICD-10-CM

## 2014-01-26 DIAGNOSIS — M25579 Pain in unspecified ankle and joints of unspecified foot: Secondary | ICD-10-CM

## 2014-01-26 DIAGNOSIS — M25572 Pain in left ankle and joints of left foot: Principal | ICD-10-CM

## 2014-01-26 DIAGNOSIS — G8929 Other chronic pain: Secondary | ICD-10-CM

## 2014-01-26 NOTE — Progress Notes (Signed)
Pre visit review using our clinic review tool, if applicable. No additional management support is needed unless otherwise documented below in the visit note. 

## 2014-01-26 NOTE — Progress Notes (Signed)
   Subjective:    Patient ID: Mark Pruitt, male    DOB: [birth year], 73 y.o.   MRN: [id number]  Ankle Pain    Patient seen for the following  Ankle injury back in January of this year. He had an inversion-type injury with significant swelling mostly involving the lateral compartment ankle. He is improved and his edema has resolved but he has continued ankle pain laterally. No instability. Pain with ambulation. No pain at rest. No Achilles pain. Plain x-rays back in January did not reveal any fracture or other acute bony abnormality. No prior history of ankle problems.  Recently seen 2 weeks ago for abdominal pain and bloating. He took omeprazole for a few days and his pain resolved. He's had no stool changes. Colonoscopy back in 2010 with recommended five-year followup which will be this year. No bloody stools. No appetite or weight changes. No fevers or chills.  Hypertension which is treated with lisinopril. Blood pressure stable. No dizziness. No chest pains.  Past Medical History  Diagnosis Date  . COLONIC POLYPS 07/25/2009  . ELEVATED PROSTATE SPECIFIC ANTIGEN 07/27/2009  . HYPERTROPHY PROSTATE W/UR OBST & OTH LUTS 07/25/2009  . LICHEN SIMPLEX CHRONICUS 02/03/2009  . Other primary cardiomyopathies 08/08/2010   Past Surgical History  Procedure Laterality Date  . Prostate surgery      reports that he quit smoking about 30 years ago. His smoking use included Cigarettes. He has a 20 pack-year smoking history. He does not have any smokeless tobacco history on file. His alcohol and drug histories are not on file. family history includes Anuerysm (age of onset: 35) in his father; Cancer in his mother. There is no history of Arthritis. No Known Allergies    Review of Systems  Constitutional: Negative for fatigue.  Eyes: Negative for visual disturbance.  Respiratory: Negative for cough, chest tightness and shortness of breath.   Cardiovascular: Negative for chest pain, palpitations  and leg swelling.  Gastrointestinal: Negative for nausea, vomiting, abdominal pain, diarrhea and blood in stool.  Musculoskeletal: Positive for arthralgias.  Neurological: Negative for dizziness, syncope, weakness, light-headedness and headaches.       Objective:   Physical Exam  Constitutional: He appears well-developed and well-nourished.  Cardiovascular: Normal rate and regular rhythm.   Pulmonary/Chest: Effort normal and breath sounds normal. No respiratory distress. He has no wheezes. He has no rales.  Musculoskeletal: He exhibits no edema.  Left ankle reveals no edema. He has some minimal tenderness just inferior to the lateral malleolus. Achilles is intact and nontender. No proximal fifth metatarsal tenderness. No edema. No ecchymosis. Full range of motion.  Mild pain with ankle inversion          Assessment & Plan:  #1 chronic left ankle pain following injury several months ago. No issues of instability. Given duration of symptoms schedule MRI to further assess #2 hypertension which is stable at goal #3 abdominal pain resolved #4 health maintenance. Schedule complete physical. He'll be due for repeat colonoscopy later this year

## 2014-02-03 ENCOUNTER — Ambulatory Visit
Admission: RE | Admit: 2014-02-03 | Discharge: 2014-02-03 | Disposition: A | Discharge status: Home / Self Care | Payer: Commercial Managed Care - HMO | Source: Ambulatory Visit | Attending: Family Medicine | Admitting: Family Medicine

## 2014-02-03 DIAGNOSIS — M25572 Pain in left ankle and joints of left foot: Principal | ICD-10-CM

## 2014-02-03 DIAGNOSIS — G8929 Other chronic pain: Secondary | ICD-10-CM

## 2014-02-24 ENCOUNTER — Telehealth: Payer: Self-pay | Admitting: Family Medicine

## 2014-02-24 DIAGNOSIS — C61 Malignant neoplasm of prostate: Secondary | ICD-10-CM

## 2014-02-24 NOTE — Telephone Encounter (Signed)
Referral ordered

## 2014-02-24 NOTE — Telephone Encounter (Signed)
OK 

## 2014-02-24 NOTE — Telephone Encounter (Signed)
Pt states Mcarthur Rossetti is requesting a referral, pt is needing a referral to dr. Alinda Money, pt has an appt on 03/03/14 for his normal 6 mo check up. neeed referral in place by then.

## 2014-05-19 ENCOUNTER — Ambulatory Visit (INDEPENDENT_AMBULATORY_CARE_PROVIDER_SITE_OTHER): Payer: Commercial Managed Care - HMO | Admitting: Family Medicine

## 2014-05-19 DIAGNOSIS — Z23 Encounter for immunization: Secondary | ICD-10-CM

## 2014-07-15 ENCOUNTER — Encounter: Payer: Self-pay | Admitting: Gastroenterology

## 2014-09-06 ENCOUNTER — Ambulatory Visit (AMBULATORY_SURGERY_CENTER): Payer: Self-pay | Admitting: *Deleted

## 2014-09-06 VITALS — Ht 72.0 in | Wt 195.0 lb

## 2014-09-06 DIAGNOSIS — Z860100 Personal history of colon polyps, unspecified: Secondary | ICD-10-CM

## 2014-09-06 MED ORDER — MOVIPREP 100 G PO SOLR: ORAL | Status: DC

## 2014-09-06 NOTE — Progress Notes (Signed)
Patient denies any allergies to eggs or soy. Patient denies any problems with anesthesia/sedation. Patient denies any oxygen use at home and does not take any diet/weight loss medications. EMMI education assisgned to patient's wife on colonoscopy(they use the same email per pt), this was explained and instructions given to patient.

## 2014-09-08 ENCOUNTER — Other Ambulatory Visit: Payer: Self-pay | Admitting: Family Medicine

## 2014-09-21 ENCOUNTER — Ambulatory Visit (AMBULATORY_SURGERY_CENTER): Payer: Commercial Managed Care - HMO | Admitting: Gastroenterology

## 2014-09-21 ENCOUNTER — Encounter: Payer: Self-pay | Admitting: Gastroenterology

## 2014-09-21 VITALS — BP 103/55 | HR 62 | Temp 97.5°F | Resp 20 | Ht 72.0 in | Wt 195.0 lb

## 2014-09-21 DIAGNOSIS — D123 Benign neoplasm of transverse colon: Secondary | ICD-10-CM

## 2014-09-21 DIAGNOSIS — Z860100 Personal history of colon polyps, unspecified: Secondary | ICD-10-CM

## 2014-09-21 DIAGNOSIS — D124 Benign neoplasm of descending colon: Secondary | ICD-10-CM

## 2014-09-21 DIAGNOSIS — K5730 Diverticulosis of large intestine without perforation or abscess without bleeding: Secondary | ICD-10-CM

## 2014-09-21 MED ORDER — SODIUM CHLORIDE 0.9 % IV SOLN: 500.0000 mL | INTRAVENOUS | Status: DC

## 2014-09-21 NOTE — Op Note (Signed)
[place]  [address]. [place], [id number]   COLONOSCOPY PROCEDURE REPORT  PATIENT: Mark Pruitt, Mark Pruitt  MR#: [id number] BIRTHDATE: 20-Jan-1941 , 23  yrs. old GENDER: male ENDOSCOPIST: Milus Banister, MD PROCEDURE DATE:  09/21/2014 PROCEDURE:   Colonoscopy with snare polypectomy First Screening Colonoscopy - Avg.  risk and is 50 yrs.  old or older - No.  Prior Negative Screening - Now for repeat screening. N/A  History of Adenoma - Now for follow-up colonoscopy & has been > or = to 3 yrs.  Yes hx of adenoma.  Has been 3 or more years since last colonoscopy.  Polyps Removed Today? Yes. ASA CLASS:   Class II INDICATIONS:adenomatous polyps remotely, last colonoscopy 2010 [name] without adenomatous polyps. MEDICATIONS: Monitored anesthesia care and Propofol 200 mg IV  DESCRIPTION OF PROCEDURE:   After the risks benefits and alternatives of the procedure were thoroughly explained, informed consent was obtained.  The digital rectal exam revealed no abnormalities of the rectum.   The LB [id number]  endoscope was introduced through the anus and advanced to the cecum, which was identified by both the appendix and ileocecal valve. No adverse events experienced.   The quality of the prep was excellent.  The instrument was then slowly withdrawn as the colon was fully examined.   COLON FINDINGS: Four polyps were found, removed and sent to pathology.  These were all sessile, located in transverse and descending segment, ranged in size from 4-29mm across, all removed with cold snare.  There were multiple diverticulum in the left colon.  The examination was otherwise normal.  Retroflexed views revealed no abnormalities. The time to cecum=2 minutes 08 seconds. Withdrawal time=15 minutes 00 seconds.  The scope was withdrawn and the procedure completed. COMPLICATIONS: There were no immediate complications.  ENDOSCOPIC IMPRESSION: Four polyps were found, removed and  sent to pathology. There were multiple diverticulum in the left colon. The examination was otherwise normal  RECOMMENDATIONS: If the polyp(s) removed today are proven to be adenomatous (pre-cancerous) polyps, you will need a colonoscopy in 3-5 years. You will receive a letter within 1-2 weeks with the results of your biopsy as well as final recommendations.  Please call my office if you have not received a letter after 3 weeks.  eSigned:  Milus Banister, MD 09/21/2014 10:56 AM   cc:  Eduard Clos, MD

## 2014-09-21 NOTE — Patient Instructions (Signed)
YOU HAD AN ENDOSCOPIC PROCEDURE TODAY AT [facility]: Refer to the procedure report that was given to you for any specific questions about what was found during the examination.  If the procedure report does not answer your questions, please call your gastroenterologist to clarify.  If you requested that your care partner not be given the details of your procedure findings, then the procedure report has been included in a sealed envelope for you to review at your convenience later.  YOU SHOULD EXPECT: Some feelings of bloating in the abdomen. Passage of more gas than usual.  Walking can help get rid of the air that was put into your GI tract during the procedure and reduce the bloating. If you had a lower endoscopy (such as a colonoscopy or flexible sigmoidoscopy) you may notice spotting of blood in your stool or on the toilet paper. If you underwent a bowel prep for your procedure, then you may not have a normal bowel movement for a few days.  DIET: Your first meal following the procedure should be a light meal and then it is ok to progress to your normal diet.  A half-sandwich or bowl of soup is an example of a good first meal.  Heavy or fried foods are harder to digest and may make you feel nauseous or bloated.  Likewise meals heavy in dairy and vegetables can cause extra gas to form and this can also increase the bloating.  Drink plenty of fluids but you should avoid alcoholic beverages for 24 hours. Try to increase the fiber in your diet.  ACTIVITY: Your care partner should take you home directly after the procedure.  You should plan to take it easy, moving slowly for the rest of the day.  You can resume normal activity the day after the procedure however you should NOT DRIVE or use heavy machinery for 24 hours (because of the sedation medicines used during the test).    SYMPTOMS TO REPORT IMMEDIATELY: A gastroenterologist can be reached at any hour.  During normal business hours, 8:30  AM to 5:00 PM Monday through Friday, call [phone].  After hours and on weekends, please call the GI answering service at [phone] who will take a message and have the physician on call contact you.   Following lower endoscopy (colonoscopy or flexible sigmoidoscopy):  Excessive amounts of blood in the stool  Significant tenderness or worsening of abdominal pains  Swelling of the abdomen that is new, acute  Fever of 100F or higher  FOLLOW UP: If any biopsies were taken you will be contacted by phone or by letter within the next 1-3 weeks.  Call your gastroenterologist if you have not heard about the biopsies in 3 weeks.  Our staff will call the home number listed on your records the next business day following your procedure to check on you and address any questions or concerns that you may have at that time regarding the information given to you following your procedure. This is a courtesy call and so if there is no answer at the home number and we have not heard from you through the emergency physician on call, we will assume that you have returned to your regular daily activities without incident.  SIGNATURES/CONFIDENTIALITY: You and/or your care partner have signed paperwork which will be entered into your electronic medical record.  These signatures attest to the fact that that the information above on your After Visit Summary has been reviewed and is understood.  Full responsibility of the confidentiality of this discharge information lies with you and/or your care-partner.  Be sure to read all of the handouts given to you by your recovery room nurse.

## 2014-09-21 NOTE — Progress Notes (Signed)
Called to room to assist during endoscopic procedure.  Patient ID and intended procedure confirmed with present staff. Received instructions for my participation in the procedure from the performing physician.  

## 2014-09-21 NOTE — Progress Notes (Signed)
Patient awakening,vss,report to rn 

## 2014-09-22 ENCOUNTER — Telehealth: Payer: Self-pay | Admitting: *Deleted

## 2014-09-22 NOTE — Telephone Encounter (Signed)
  Follow up Call-  Call back number 09/21/2014  Post procedure Call Back phone  # [phone]  Permission to leave phone message Yes     Patient questions:  Do you have a fever, pain , or abdominal swelling? No. Pain Score  0 *  Have you tolerated food without any problems? Yes.    Have you been able to return to your normal activities? Yes.    Do you have any questions about your discharge instructions: Diet   No. Medications  No. Follow up visit  No.  Do you have questions or concerns about your Care? No.  Actions: * If pain score is 4 or above: No action needed, pain <4.

## 2014-09-29 ENCOUNTER — Encounter: Payer: Self-pay | Admitting: Gastroenterology

## 2014-12-09 ENCOUNTER — Telehealth: Payer: Self-pay | Admitting: Family Medicine

## 2014-12-09 MED ORDER — LISINOPRIL 5 MG PO TABS: ORAL_TABLET | ORAL | Status: DC

## 2014-12-09 NOTE — Telephone Encounter (Signed)
Pt request refill of the following: lisinopril (PRINIVIL,ZESTRIL) 5 MG tablet  Pt said he need a 90 day supply with 3 refills   Phamacy: [facility]

## 2014-12-09 NOTE — Telephone Encounter (Signed)
Rx sent to mail order. Patient needs office visit.

## 2015-02-21 ENCOUNTER — Ambulatory Visit (INDEPENDENT_AMBULATORY_CARE_PROVIDER_SITE_OTHER): Payer: Medicare Other | Admitting: Family Medicine

## 2015-02-21 ENCOUNTER — Encounter: Payer: Self-pay | Admitting: Family Medicine

## 2015-02-21 VITALS — BP 110/80 | HR 88 | Temp 97.9°F | Wt 189.0 lb

## 2015-02-21 DIAGNOSIS — R1084 Generalized abdominal pain: Secondary | ICD-10-CM

## 2015-02-21 DIAGNOSIS — R935 Abnormal findings on diagnostic imaging of other abdominal regions, including retroperitoneum: Secondary | ICD-10-CM | POA: Diagnosis not present

## 2015-02-21 DIAGNOSIS — R109 Unspecified abdominal pain: Secondary | ICD-10-CM

## 2015-02-21 LAB — BASIC METABOLIC PANEL WITH GFR
BUN: 13 mg/dL (ref 6–23)
CO2: 30 meq/L (ref 19–32)
Calcium: 10 mg/dL (ref 8.4–10.5)
Chloride: 101 meq/L (ref 96–112)
Creatinine, Ser: 0.93 mg/dL (ref 0.40–1.50)
GFR: 84.07 mL/min (ref >60.00)
Glucose, Bld: 111 mg/dL — ABNORMAL HIGH (ref 70–99)
Potassium: 4.3 meq/L (ref 3.5–5.1)
Sodium: 136 meq/L (ref 135–145)

## 2015-02-21 LAB — CBC WITH DIFFERENTIAL/PLATELET
Basophils Absolute: 0 K/uL (ref 0.0–0.1)
Basophils Relative: 0.2 % (ref 0.0–3.0)
Eosinophils Absolute: 0.1 K/uL (ref 0.0–0.7)
Eosinophils Relative: 0.8 % (ref 0.0–5.0)
HCT: 44.3 % (ref 39.0–52.0)
Hemoglobin: 14.9 g/dL (ref 13.0–17.0)
Lymphocytes Relative: 9.9 % — ABNORMAL LOW (ref 12.0–46.0)
Lymphs Abs: 1.3 K/uL (ref 0.7–4.0)
MCHC: 33.6 g/dL (ref 30.0–36.0)
MCV: 88.5 fl (ref 78.0–100.0)
Monocytes Absolute: 1.4 K/uL — ABNORMAL HIGH (ref 0.1–1.0)
Monocytes Relative: 10.6 % (ref 3.0–12.0)
Neutro Abs: 10.6 K/uL — ABNORMAL HIGH (ref 1.4–7.7)
Neutrophils Relative %: 78.5 % — ABNORMAL HIGH (ref 43.0–77.0)
Platelets: 197 K/uL (ref 150.0–400.0)
RBC: 5 Mil/uL (ref 4.22–5.81)
RDW: 14 % (ref 11.5–15.5)
WBC: 13.5 K/uL — ABNORMAL HIGH (ref 4.0–10.5)

## 2015-02-21 LAB — LIPASE: Lipase: 76 U/L — ABNORMAL HIGH (ref 11.0–59.0)

## 2015-02-21 LAB — HEPATIC FUNCTION PANEL
ALT: 14 U/L (ref 0–53)
AST: 16 U/L (ref 0–37)
Albumin: 4.1 g/dL (ref 3.5–5.2)
Alkaline Phosphatase: 52 U/L (ref 39–117)
Bilirubin, Direct: 0.3 mg/dL (ref 0.0–0.3)
Total Bilirubin: 1.4 mg/dL — ABNORMAL HIGH (ref 0.2–1.2)
Total Protein: 7.8 g/dL (ref 6.0–8.3)

## 2015-02-21 MED ORDER — TRIAMCINOLONE ACETONIDE 0.1 % EX CREA: 1.0000 "application " | TOPICAL_CREAM | Freq: Two times a day (BID) | CUTANEOUS | Status: DC | PRN

## 2015-02-21 NOTE — Progress Notes (Signed)
   Subjective:    Patient ID: Mark Pruitt, male    DOB: 05/22/1941, 74 y.o.   MRN: [id number]  HPI Acute visit for abdominal pain. Onset couple days ago of some diffuse relatively constant dull bilateral poorly localized upper abdominal pain. No exacerbating factors. He's had some poor appetite and slight nausea but no vomiting. No diarrhea. No bloody stools. Last bowel movement 2 days ago relatively normal. Patient states he had temperature 100.5 yesterday but no documented fever today. He had colonoscopy December 2015 with diverticular changes left:Marland Kitchen He has not had any left lower quadrant abdominal pain. No dysuria.  No alleviating factors. No clear exacerbating factors other than possibly worse with eating. He feels somewhat "bloated". No recent weight changes.  Past Medical History  Diagnosis Date  . COLONIC POLYPS 07/25/2009  . ELEVATED PROSTATE SPECIFIC ANTIGEN 07/27/2009  . HYPERTROPHY PROSTATE W/UR OBST & OTH LUTS 07/25/2009  . LICHEN SIMPLEX CHRONICUS 02/03/2009  . Other primary cardiomyopathies 08/08/2010  . Irregular heart beat   . Hearing loss of both ears    Past Surgical History  Procedure Laterality Date  . Prostate surgery      reports that he quit smoking about 31 years ago. His smoking use included Cigarettes. He has a 20 pack-year smoking history. He has never used smokeless tobacco. He reports that he does not drink alcohol or use illicit drugs. family history includes Anuerysm (age of onset: 58) in his father; Cancer in his mother. There is no history of Arthritis or Colon cancer. No Known Allergies    Review of Systems  Constitutional: Positive for fever and appetite change. Negative for chills and unexpected weight change.  Respiratory: Negative for cough and shortness of breath.   Cardiovascular: Negative for chest pain.  Gastrointestinal: Positive for nausea and abdominal pain. Negative for vomiting, diarrhea, constipation and blood in stool.    Genitourinary: Negative for dysuria.  Neurological: Negative for dizziness and weakness.       Objective:   Physical Exam  Constitutional: He appears well-developed and well-nourished. No distress.  HENT:  Mouth/Throat: Oropharynx is clear and moist.  Neck: Neck supple. No thyromegaly present.  Cardiovascular: Normal rate and regular rhythm.   Pulmonary/Chest: Effort normal and breath sounds normal. No respiratory distress. He has no wheezes. He has no rales.  Abdominal: Soft. Bowel sounds are normal. He exhibits no distension and no mass. There is tenderness. There is no rebound and no guarding.  Minimally tender right upper quadrant to deep palpation. No guarding or rebound. No mass.  Skin: No rash noted.          Assessment & Plan:  Abdominal pain. He has somewhat diffuse bilateral upper abdominal pain. Nonacute abdomen. Does have some localized tenderness right upper quadrant .  Would consider symptomatic gallstones and differential if symptoms persist. No documented fever today but reported low-grade yesterday. Start with labs CBC, comprehensive metabolic panel, lipase. Consider ultrasound if symptoms persist Follow-up immediately for recurrent fever or worsening symptoms

## 2015-02-21 NOTE — Patient Instructions (Signed)
Abdominal Pain °Many things can cause abdominal pain. Usually, abdominal pain is not caused by a disease and will improve without treatment. It can often be observed and treated at home. Your health care provider will do a physical exam and possibly order blood tests and X-rays to help determine the seriousness of your pain. However, in many cases, more time must pass before a clear cause of the pain can be found. Before that point, your health care provider may not know if you need more testing or further treatment. °HOME CARE INSTRUCTIONS  °Monitor your abdominal pain for any changes. The following actions may help to alleviate any discomfort you are experiencing: °· Only take over-the-counter or prescription medicines as directed by your health care provider. °· Do not take laxatives unless directed to do so by your health care provider. °· Try a clear liquid diet (broth, tea, or water) as directed by your health care provider. Slowly move to a bland diet as tolerated. °SEEK MEDICAL CARE IF: °· You have unexplained abdominal pain. °· You have abdominal pain associated with nausea or diarrhea. °· You have pain when you urinate or have a bowel movement. °· You experience abdominal pain that wakes you in the night. °· You have abdominal pain that is worsened or improved by eating food. °· You have abdominal pain that is worsened with eating fatty foods. °· You have a fever. °SEEK IMMEDIATE MEDICAL CARE IF:  °· Your pain does not go away within 2 hours. °· You keep throwing up (vomiting). °· Your pain is felt only in portions of the abdomen, such as the right side or the left lower portion of the abdomen. °· You pass bloody or black tarry stools. °MAKE SURE YOU: °· Understand these instructions.   °· Will watch your condition.   °· Will get help right away if you are not doing well or get worse.   °Document Released: 06/27/2005 Document Revised: 09/22/2013 Document Reviewed: 05/27/2013 °[employer]® Patient Information  ©2015 [employer]. This information is not intended to replace advice given to you by your health care provider. Make sure you discuss any questions you have with your health care provider. ° °

## 2015-02-21 NOTE — Progress Notes (Signed)
Pre visit review using our clinic review tool, if applicable. No additional management support is needed unless otherwise documented below in the visit note. 

## 2015-02-22 ENCOUNTER — Other Ambulatory Visit: Payer: Self-pay | Admitting: Family Medicine

## 2015-02-22 DIAGNOSIS — R1084 Generalized abdominal pain: Secondary | ICD-10-CM

## 2015-02-22 DIAGNOSIS — R748 Abnormal levels of other serum enzymes: Secondary | ICD-10-CM

## 2015-03-02 ENCOUNTER — Ambulatory Visit
Admission: RE | Admit: 2015-03-02 | Discharge: 2015-03-02 | Disposition: A | Discharge status: Home / Self Care | Payer: Medicare Other | Source: Ambulatory Visit | Attending: Family Medicine | Admitting: Family Medicine

## 2015-03-02 ENCOUNTER — Other Ambulatory Visit: Payer: Self-pay | Admitting: Family Medicine

## 2015-03-02 DIAGNOSIS — R748 Abnormal levels of other serum enzymes: Secondary | ICD-10-CM

## 2015-03-02 DIAGNOSIS — R1084 Generalized abdominal pain: Secondary | ICD-10-CM

## 2015-03-02 DIAGNOSIS — R109 Unspecified abdominal pain: Secondary | ICD-10-CM

## 2015-03-09 ENCOUNTER — Ambulatory Visit
Admission: RE | Admit: 2015-03-09 | Discharge: 2015-03-09 | Disposition: A | Discharge status: Home / Self Care | Payer: Medicare Other | Source: Ambulatory Visit | Attending: Family Medicine | Admitting: Family Medicine

## 2015-03-09 DIAGNOSIS — R109 Unspecified abdominal pain: Secondary | ICD-10-CM

## 2015-03-09 DIAGNOSIS — K5730 Diverticulosis of large intestine without perforation or abscess without bleeding: Secondary | ICD-10-CM | POA: Diagnosis not present

## 2015-03-09 DIAGNOSIS — R748 Abnormal levels of other serum enzymes: Secondary | ICD-10-CM

## 2015-03-09 DIAGNOSIS — C61 Malignant neoplasm of prostate: Secondary | ICD-10-CM | POA: Diagnosis not present

## 2015-03-09 DIAGNOSIS — K769 Liver disease, unspecified: Secondary | ICD-10-CM | POA: Diagnosis not present

## 2015-03-09 DIAGNOSIS — Z8546 Personal history of malignant neoplasm of prostate: Secondary | ICD-10-CM | POA: Diagnosis not present

## 2015-03-09 MED ORDER — IOPAMIDOL (ISOVUE-300) INJECTION 61%
75.0000 mL | Freq: Once | INTRAVENOUS | Status: AC | PRN
  Administered 2015-03-09: 75 mL via INTRAVENOUS

## 2015-03-13 NOTE — Addendum Note (Signed)
Addended by: Eulas Post on: 03/13/2015 11:36 AM   Modules accepted: Orders

## 2015-03-15 ENCOUNTER — Other Ambulatory Visit: Payer: Self-pay | Admitting: Family Medicine

## 2015-03-20 ENCOUNTER — Inpatient Hospital Stay: Admission: RE | Admit: 2015-03-20 | Payer: Medicare Other | Source: Ambulatory Visit

## 2015-03-23 DIAGNOSIS — N5201 Erectile dysfunction due to arterial insufficiency: Secondary | ICD-10-CM | POA: Diagnosis not present

## 2015-03-23 DIAGNOSIS — C61 Malignant neoplasm of prostate: Secondary | ICD-10-CM | POA: Diagnosis not present

## 2015-04-01 ENCOUNTER — Ambulatory Visit
Admission: RE | Admit: 2015-04-01 | Discharge: 2015-04-01 | Disposition: A | Discharge status: Home / Self Care | Payer: Medicare Other | Source: Ambulatory Visit | Attending: Family Medicine | Admitting: Family Medicine

## 2015-04-01 DIAGNOSIS — D1803 Hemangioma of intra-abdominal structures: Secondary | ICD-10-CM | POA: Diagnosis not present

## 2015-04-01 DIAGNOSIS — R1084 Generalized abdominal pain: Secondary | ICD-10-CM

## 2015-04-01 DIAGNOSIS — R935 Abnormal findings on diagnostic imaging of other abdominal regions, including retroperitoneum: Secondary | ICD-10-CM

## 2015-04-01 DIAGNOSIS — K7689 Other specified diseases of liver: Secondary | ICD-10-CM | POA: Diagnosis not present

## 2015-04-01 MED ORDER — GADOBENATE DIMEGLUMINE 529 MG/ML IV SOLN
17.0000 mL | Freq: Once | INTRAVENOUS | Status: AC | PRN
  Administered 2015-04-01: 17 mL via INTRAVENOUS

## 2015-05-06 ENCOUNTER — Encounter: Payer: Self-pay | Admitting: Adult Health

## 2015-05-06 ENCOUNTER — Ambulatory Visit (INDEPENDENT_AMBULATORY_CARE_PROVIDER_SITE_OTHER): Payer: Medicare Other | Admitting: Adult Health

## 2015-05-06 DIAGNOSIS — T148 Other injury of unspecified body region: Secondary | ICD-10-CM

## 2015-05-06 DIAGNOSIS — IMO0002 Reserved for concepts with insufficient information to code with codable children: Secondary | ICD-10-CM

## 2015-05-06 MED ORDER — CEPHALEXIN 500 MG PO CAPS: 500.0000 mg | ORAL_CAPSULE | Freq: Two times a day (BID) | ORAL | Status: DC

## 2015-05-06 NOTE — Patient Instructions (Addendum)
I have sent in an antibiotic called Keflex, please take this twice a day for a week. In 10 days, please come back and see me to have your sutures removed. If you notice any signs of infection, please follow up right away.     Laceration Care, Adult A laceration is a cut or lesion that goes through all layers of the skin and into the tissue just beneath the skin. TREATMENT  Some lacerations may not require closure. Some lacerations may not be able to be closed due to an increased risk of infection. It is important to see your caregiver as soon as possible after an injury to minimize the risk of infection and maximize the opportunity for successful closure. If closure is appropriate, pain medicines may be given, if needed. The wound will be cleaned to help prevent infection. Your caregiver will use stitches (sutures), staples, wound glue (adhesive), or skin adhesive strips to repair the laceration. These tools bring the skin edges together to allow for faster healing and a better cosmetic outcome. However, all wounds will heal with a scar. Once the wound has healed, scarring can be minimized by covering the wound with sunscreen during the day for 1 full year. HOME CARE INSTRUCTIONS  For sutures or staples:  Keep the wound clean and dry.  If you were given a bandage (dressing), you should change it at least once a day. Also, change the dressing if it becomes wet or dirty, or as directed by your caregiver.  Wash the wound with soap and water 2 times a day. Rinse the wound off with water to remove all soap. Pat the wound dry with a clean towel.  After cleaning, apply a thin layer of the antibiotic ointment as recommended by your caregiver. This will help prevent infection and keep the dressing from sticking.  You may shower as usual after the first 24 hours. Do not soak the wound in water until the sutures are removed.  Only take over-the-counter or prescription medicines for pain, discomfort, or  fever as directed by your caregiver.  Get your sutures or staples removed as directed by your caregiver. For skin adhesive strips:  Keep the wound clean and dry.  Do not get the skin adhesive strips wet. You may bathe carefully, using caution to keep the wound dry.  If the wound gets wet, pat it dry with a clean towel.  Skin adhesive strips will fall off on their own. You may trim the strips as the wound heals. Do not remove skin adhesive strips that are still stuck to the wound. They will fall off in time. For wound adhesive:  You may briefly wet your wound in the shower or bath. Do not soak or scrub the wound. Do not swim. Avoid periods of heavy perspiration until the skin adhesive has fallen off on its own. After showering or bathing, gently pat the wound dry with a clean towel.  Do not apply liquid medicine, cream medicine, or ointment medicine to your wound while the skin adhesive is in place. This may loosen the film before your wound is healed.  If a dressing is placed over the wound, be careful not to apply tape directly over the skin adhesive. This may cause the adhesive to be pulled off before the wound is healed.  Avoid prolonged exposure to sunlight or tanning lamps while the skin adhesive is in place. Exposure to ultraviolet light in the first year will darken the scar.  The skin adhesive will  usually remain in place for 5 to 10 days, then naturally fall off the skin. Do not pick at the adhesive film. You may need a tetanus shot if:  You cannot remember when you had your last tetanus shot.  You have never had a tetanus shot. If you get a tetanus shot, your arm may swell, get red, and feel warm to the touch. This is common and not a problem. If you need a tetanus shot and you choose not to have one, there is a rare chance of getting tetanus. Sickness from tetanus can be serious. SEEK MEDICAL CARE IF:   You have redness, swelling, or increasing pain in the wound.  You see  a red line that goes away from the wound.  You have yellowish-white fluid (pus) coming from the wound.  You have a fever.  You notice a bad smell coming from the wound or dressing.  Your wound breaks open before or after sutures have been removed.  You notice something coming out of the wound such as wood or glass.  Your wound is on your hand or foot and you cannot move a finger or toe. SEEK IMMEDIATE MEDICAL CARE IF:   Your pain is not controlled with prescribed medicine.  You have severe swelling around the wound causing pain and numbness or a change in color in your arm, hand, leg, or foot.  Your wound splits open and starts bleeding.  You have worsening numbness, weakness, or loss of function of any joint around or beyond the wound.  You develop painful lumps near the wound or on the skin anywhere on your body. MAKE SURE YOU:   Understand these instructions.  Will watch your condition.  Will get help right away if you are not doing well or get worse. Document Released: 09/17/2005 Document Revised: 12/10/2011 Document Reviewed: 03/13/2011 [facility] Patient Information 2015 [place], [place]. This information is not intended to replace advice given to you by your health care provider. Make sure you discuss any questions you have with your health care provider.

## 2015-05-06 NOTE — Progress Notes (Signed)
Subjective:    Patient ID: Mark Pruitt, male    DOB: 12/02/1940, 74 y.o.   MRN: [id number]  HPI  74 year old gentleman who presents to the office today for laceration to right lateral lower leg during golf today. He was in the woods and slipped on a wet rock, which caused the laceration. Bleeding was controlled. He is up to date on his Tetanus.  Review of Systems  Skin: Positive for wound.  Neurological: Positive for dizziness. Negative for light-headedness.   Past Medical History  Diagnosis Date  . COLONIC POLYPS 07/25/2009  . ELEVATED PROSTATE SPECIFIC ANTIGEN 07/27/2009  . HYPERTROPHY PROSTATE W/UR OBST & OTH LUTS 07/25/2009  . LICHEN SIMPLEX CHRONICUS 02/03/2009  . Other primary cardiomyopathies 08/08/2010  . Irregular heart beat   . Hearing loss of both ears     History   Social History  . Marital Status: Married    Spouse Name: N/A  . Number of Children: N/A  . Years of Education: N/A   Occupational History  . Not on file.   Social History Main Topics  . Smoking status: Former Smoker -- 1.00 packs/day for 20 years    Types: Cigarettes    Quit date: 10/22/1983  . Smokeless tobacco: Never Used  . Alcohol Use: No  . Drug Use: No  . Sexual Activity: Not on file   Other Topics Concern  . Not on file   Social History Narrative   From [place] originally     Past Surgical History  Procedure Laterality Date  . Prostate surgery      Family History  Problem Relation Age of Onset  . Arthritis Neg Hx     family hx  . Colon cancer Neg Hx   . Cancer Mother     parathyroid adenoma  . Anuerysm Father 34    abdominal    No Known Allergies  Current Outpatient Prescriptions on File Prior to Visit  Medication Sig Dispense Refill  . aspirin 81 MG tablet Take 81 mg by mouth daily.    Marland Kitchen Bioflavonoid Products (ESTER C PO) Take by mouth daily.    . Flaxseed, Linseed, (EQL FLAX SEED OIL) 1000 MG CAPS Take by mouth daily.    Marland Kitchen lisinopril (PRINIVIL,ZESTRIL)  5 MG tablet Take 1 tablet by mouth once daily 90 tablet 2  . Multiple Vitamin (MULTIVITAMIN) tablet Take 1 tablet by mouth daily.    Marland Kitchen triamcinolone cream (KENALOG) 0.1 % Apply 1 application topically 2 (two) times daily as needed. 30 g 3   No current facility-administered medications on file prior to visit.    There were no vitals taken for this visit.       Objective:   Physical Exam  Constitutional: He is oriented to person, place, and time. He appears well-developed and well-nourished. No distress.  Neurological: He is alert and oriented to person, place, and time.  Skin: Skin is warm and dry. He is not diaphoretic.     4 cm partial thickness wound to right lateral lower leg. Well approximated. No debris noted. Bleeding controlled.    Psychiatric: He has a normal mood and affect. His behavior is normal. Judgment and thought content normal.      Assessment & Plan:  1. Laceration - Area cleaned and irrigated with 239ml normal saline. There was no debris noted in wound. After cleaning, area around laceration was prepped with iodine and alcohol scrub. 2 ml lidocaine with epi was injected into subcutaneous tissues  on both sides of wound . Sterile drape was placed over wound. Using sterile technique, four simple sutures were placed using 4-0 monofilament. Steri strips were placed on both sided of the wound. Antibiotic ointment was placed over sutures. Patient tolerated procedure well.  - cephALEXin (KEFLEX) 500 MG capsule; Take 1 capsule (500 mg total) by mouth 2 (two) times daily.  Dispense: 14 capsule; Refill: 0 - UTD on Tetanus.  - Return in 10 days for suture removal.  - Educated on signs and symptoms of infection and has return precautions . - Greater than 30 minutes spent with patient do to procedure.

## 2015-05-09 ENCOUNTER — Ambulatory Visit: Payer: Medicare Other | Admitting: Adult Health

## 2015-05-13 ENCOUNTER — Telehealth: Payer: Self-pay | Admitting: Family Medicine

## 2015-05-13 ENCOUNTER — Ambulatory Visit (INDEPENDENT_AMBULATORY_CARE_PROVIDER_SITE_OTHER): Payer: Medicare Other | Admitting: Family Medicine

## 2015-05-13 ENCOUNTER — Encounter: Payer: Self-pay | Admitting: Family Medicine

## 2015-05-13 VITALS — BP 120/70 | HR 52 | Temp 97.5°F | Wt 188.0 lb

## 2015-05-13 DIAGNOSIS — IMO0002 Reserved for concepts with insufficient information to code with codable children: Secondary | ICD-10-CM

## 2015-05-13 DIAGNOSIS — T148 Other injury of unspecified body region: Secondary | ICD-10-CM

## 2015-05-13 MED ORDER — CEPHALEXIN 500 MG PO CAPS: 500.0000 mg | ORAL_CAPSULE | Freq: Three times a day (TID) | ORAL | Status: DC

## 2015-05-13 NOTE — Patient Instructions (Signed)
-  try to avoid excessive movement of this leg  -keflex three times daily for 3 more day  -follow up as scheduled or see a physician immediately if worsening redness, spreading redness, pain, fevers or other concerns

## 2015-05-13 NOTE — Telephone Encounter (Signed)
[facility] - Client [place] [facility] Patient Name: Mark Pruitt DOB: 09-Dec-1940 Initial Comment Caller states his leg has sutures and it is swollen and sore and there is yellow around the wound. Nurse Assessment Nurse: Erlene Quan, RN, Manuela Schwartz Date/Time Eilene Ghazi Time): 05/13/2015 12:19:27 PM Confirm and document reason for call. If symptomatic, describe symptoms. ---Caller states his leg has sutures and it is swollen and sore and there is yellow around the wound - much redder and more tender then it has been - sutures were put in last Friday Has the patient traveled out of the country within the last 30 days? ---Not Applicable Does the patient require triage? ---Yes Related visit to physician within the last 2 weeks? ---Yes Does the PT have any chronic conditions? (i.e. diabetes, asthma, etc.) ---Yes Guidelines Guideline Title Affirmed Question Affirmed Notes Wound Infection [1] Pus or cloudy fluid draining from wound AND [2] no fever Final Disposition User See Physician within [facility] [place], RN, Manuela Schwartz Comments appointment scheduled for this afternoon with Colin Benton at 2:15 pm Referrals REFERRED TO PCP OFFICE Disagree/Comply: Comply

## 2015-05-13 NOTE — Telephone Encounter (Signed)
Appt scheduled with Dr. Maudie Mercury.

## 2015-05-13 NOTE — Progress Notes (Signed)
HPI:  Acute visit for:  Laceration follow up: -saw Mark Pruitt 8/5 for lac suffered with fall in woods and cut on rock -per notes wound cleaned and 4 sutures were placed and pt was started on low dose keflex 500 bid -pt reports: thought was mildly painful in shower today and when wife evaluated wound thought she saw something white in the wound -denies: pain now, spreading redness, fevers, malaise, drainage from wound  ROS: See pertinent positives and negatives per HPI.  Past Medical History  Diagnosis Date  . COLONIC POLYPS 07/25/2009  . ELEVATED PROSTATE SPECIFIC ANTIGEN 07/27/2009  . HYPERTROPHY PROSTATE W/UR OBST & OTH LUTS 07/25/2009  . LICHEN SIMPLEX CHRONICUS 02/03/2009  . Other primary cardiomyopathies 08/08/2010  . Irregular heart beat   . Hearing loss of both ears     Past Surgical History  Procedure Laterality Date  . Prostate surgery      Family History  Problem Relation Age of Onset  . Arthritis Neg Hx     family hx  . Colon cancer Neg Hx   . Cancer Mother     parathyroid adenoma  . Anuerysm Father 77    abdominal    Social History   Social History  . Marital Status: Married    Spouse Name: N/A  . Number of Children: N/A  . Years of Education: N/A   Social History Main Topics  . Smoking status: Former Smoker -- 1.00 packs/day for 20 years    Types: Cigarettes    Quit date: 10/22/1983  . Smokeless tobacco: Never Used  . Alcohol Use: No  . Drug Use: No  . Sexual Activity: Not Asked   Other Topics Concern  . None   Social History Narrative   From [place] originally      Current outpatient prescriptions:  .  aspirin 81 MG tablet, Take 81 mg by mouth daily., Disp: , Rfl:  .  Bioflavonoid Products (ESTER C PO), Take by mouth daily., Disp: , Rfl:  .  Flaxseed, Linseed, (EQL FLAX SEED OIL) 1000 MG CAPS, Take by mouth daily., Disp: , Rfl:  .  lisinopril (PRINIVIL,ZESTRIL) 5 MG tablet, Take 1 tablet by mouth once daily, Disp: 90 tablet, Rfl: 2 .   Multiple Vitamin (MULTIVITAMIN) tablet, Take 1 tablet by mouth daily., Disp: , Rfl:  .  cephALEXin (KEFLEX) 500 MG capsule, Take 1 capsule (500 mg total) by mouth 3 (three) times daily., Disp: 9 capsule, Rfl: 0 .  triamcinolone cream (KENALOG) 0.1 %, Apply 1 application topically 2 (two) times daily as needed. (Patient not taking: Reported on 05/13/2015), Disp: 30 g, Rfl: 3  EXAM:  Filed Vitals:   05/13/15 1414  BP: 120/70  Pulse: 52  Temp: 97.5 F (36.4 C)    Body mass index is 25.49 kg/(m^2).  GENERAL: vitals reviewed and listed above, alert, oriented, appears well hydrated and in no acute distress  HEENT: atraumatic, conjunttiva clear, no obvious abnormalities on inspection of external nose and ears  NECK: no obvious masses on inspection  SKIN: healing laceration on R LE, 2 sutures in place, no drainage or inderation or warmth around wound, small area of skin hyperpigmentation around healing wound without necrosis  MS: moves all extremities without noticeable abnormality  PSYCH: pleasant and cooperative, no obvious depression or anxiety  ASSESSMENT AND PLAN:  Discussed the following assessment and plan:  Laceration  -i really don't think this is infected, suspect hyperpigmentation more likely post traumatic healing  -given pt concern about  weekend, increase keflex to tid for 3 days then stop if no change.improving -area marked and advised if any drainage, increased or spreading redness, pain or other concerns of options for evaluation over the weekend -Patient advised to return or notify a doctor immediately if symptoms worsen or persist or new concerns arise.  Patient Instructions  -try to avoid excessive movement of this leg  -keflex three times daily for 3 more day  -follow up as scheduled or see a physician immediately if worsening redness, spreading redness, pain, fevers or other concerns     KIM, HANNAH R.

## 2015-05-17 ENCOUNTER — Ambulatory Visit: Payer: Medicare Other | Admitting: Adult Health

## 2015-05-17 ENCOUNTER — Ambulatory Visit (INDEPENDENT_AMBULATORY_CARE_PROVIDER_SITE_OTHER): Payer: Medicare Other | Admitting: Adult Health

## 2015-05-17 ENCOUNTER — Encounter: Payer: Self-pay | Admitting: Adult Health

## 2015-05-17 VITALS — BP 90/60 | Temp 98.2°F | Ht 72.0 in | Wt 191.8 lb

## 2015-05-17 DIAGNOSIS — Z4802 Encounter for removal of sutures: Secondary | ICD-10-CM

## 2015-05-17 NOTE — Progress Notes (Signed)
Patient here for suture removal. He had his sutures placed by me on 05/06/2015. During the time between appointments, two sutures had come out. He had two sutures remaining. Wound appears to be healing well and there are no signs or symptoms of infection.   2 sutures removed and wound covered with abx ointment and band aid. Advised to continue to monitor for signs of infection.

## 2015-05-17 NOTE — Progress Notes (Signed)
Pre visit review using our clinic review tool, if applicable. No additional management support is needed unless otherwise documented below in the visit note. 

## 2015-07-06 ENCOUNTER — Ambulatory Visit (INDEPENDENT_AMBULATORY_CARE_PROVIDER_SITE_OTHER): Payer: Medicare Other | Admitting: *Deleted

## 2015-07-06 DIAGNOSIS — Z23 Encounter for immunization: Secondary | ICD-10-CM | POA: Diagnosis not present

## 2015-10-19 ENCOUNTER — Other Ambulatory Visit: Payer: Self-pay | Admitting: Family Medicine

## 2016-01-03 ENCOUNTER — Encounter: Payer: Self-pay | Admitting: Family Medicine

## 2016-01-03 ENCOUNTER — Ambulatory Visit (INDEPENDENT_AMBULATORY_CARE_PROVIDER_SITE_OTHER): Payer: PPO | Admitting: Family Medicine

## 2016-01-03 VITALS — BP 100/80 | HR 85 | Temp 97.9°F | Ht 72.0 in | Wt 192.3 lb

## 2016-01-03 DIAGNOSIS — Z0000 Encounter for general adult medical examination without abnormal findings: Secondary | ICD-10-CM | POA: Diagnosis not present

## 2016-01-03 DIAGNOSIS — Z23 Encounter for immunization: Secondary | ICD-10-CM

## 2016-01-03 LAB — CBC WITH DIFFERENTIAL/PLATELET
Basophils Absolute: 0 K/uL (ref 0.0–0.1)
Basophils Relative: 0.3 % (ref 0.0–3.0)
Eosinophils Absolute: 0.1 K/uL (ref 0.0–0.7)
Eosinophils Relative: 1.8 % (ref 0.0–5.0)
HCT: 45.5 % (ref 39.0–52.0)
Hemoglobin: 15 g/dL (ref 13.0–17.0)
Lymphocytes Relative: 23.2 % (ref 12.0–46.0)
Lymphs Abs: 1.6 K/uL (ref 0.7–4.0)
MCHC: 32.9 g/dL (ref 30.0–36.0)
MCV: 89.1 fl (ref 78.0–100.0)
Monocytes Absolute: 0.7 K/uL (ref 0.1–1.0)
Monocytes Relative: 9.5 % (ref 3.0–12.0)
Neutro Abs: 4.6 K/uL (ref 1.4–7.7)
Neutrophils Relative %: 65.2 % (ref 43.0–77.0)
Platelets: 173 K/uL (ref 150.0–400.0)
RBC: 5.1 Mil/uL (ref 4.22–5.81)
RDW: 14.9 % (ref 11.5–15.5)
WBC: 7 K/uL (ref 4.0–10.5)

## 2016-01-03 LAB — BASIC METABOLIC PANEL WITH GFR
BUN: 15 mg/dL (ref 6–23)
CO2: 29 meq/L (ref 19–32)
Calcium: 9.7 mg/dL (ref 8.4–10.5)
Chloride: 104 meq/L (ref 96–112)
Creatinine, Ser: 0.93 mg/dL (ref 0.40–1.50)
GFR: 83.88 mL/min (ref >60.00)
Glucose, Bld: 91 mg/dL (ref 70–99)
Potassium: 4.5 meq/L (ref 3.5–5.1)
Sodium: 139 meq/L (ref 135–145)

## 2016-01-03 LAB — HEPATIC FUNCTION PANEL
ALT: 12 U/L (ref 0–53)
AST: 14 U/L (ref 0–37)
Albumin: 4.3 g/dL (ref 3.5–5.2)
Alkaline Phosphatase: 47 U/L (ref 39–117)
Bilirubin, Direct: 0.1 mg/dL (ref 0.0–0.3)
Total Bilirubin: 0.9 mg/dL (ref 0.2–1.2)
Total Protein: 7.2 g/dL (ref 6.0–8.3)

## 2016-01-03 LAB — LIPID PANEL
Cholesterol: 179 mg/dL (ref 0–200)
HDL: 50.4 mg/dL (ref >39.00)
LDL Cholesterol: 106 mg/dL — ABNORMAL HIGH (ref 0–99)
NonHDL: 128.32
Total CHOL/HDL Ratio: 4
Triglycerides: 110 mg/dL (ref 0.0–149.0)
VLDL: 22 mg/dL (ref 0.0–40.0)

## 2016-01-03 LAB — TSH: TSH: 1.79 u[IU]/mL (ref 0.35–4.50)

## 2016-01-03 MED ORDER — LISINOPRIL 5 MG PO TABS: 5.0000 mg | ORAL_TABLET | Freq: Every day | ORAL | Status: DC

## 2016-01-03 NOTE — Progress Notes (Signed)
Subjective:    Patient ID: Mark Pruitt, male    DOB: 05-20-1941, 75 y.o.   MRN: [id number]  HPI Patient seen for physical exam. He has pressed history of prostate cancer with prior prostatectomy. He is still followed urology once yearly and they obtain his PSAs. He has occasional urine incontinence but symptoms are infrequent He has history of nonischemic cardiomyopathy and takes low-dose lisinopril. Echo few years ago revealed ejection fraction 50-55%. No dyspnea whatsoever with activity.  Health maintenance reviewed. Immunizations up-to-date. Colonoscopy little over year ago. Quit smoking many years ago after about a 15 pack year history  Past Medical History  Diagnosis Date  . COLONIC POLYPS 07/25/2009  . ELEVATED PROSTATE SPECIFIC ANTIGEN 07/27/2009  . HYPERTROPHY PROSTATE W/UR OBST & OTH LUTS 07/25/2009  . LICHEN SIMPLEX CHRONICUS 02/03/2009  . Other primary cardiomyopathies 08/08/2010  . Irregular heart beat   . Hearing loss of both ears    Past Surgical History  Procedure Laterality Date  . Prostate surgery      reports that he quit smoking about 32 years ago. His smoking use included Cigarettes. He has a 20 pack-year smoking history. He has never used smokeless tobacco. He reports that he does not drink alcohol or use illicit drugs. family history includes Anuerysm (age of onset: 18) in his father; Cancer in his mother. There is no history of Arthritis or Colon cancer. No Known Allergies   Review of Systems  Constitutional: Negative for fever, activity change, appetite change, fatigue and unexpected weight change.  HENT: Positive for hearing loss (chronic, bilateral hearing aids). Negative for congestion, ear pain and trouble swallowing.   Eyes: Negative for pain and visual disturbance.  Respiratory: Negative for cough, chest tightness, shortness of breath and wheezing.   Cardiovascular: Negative for chest pain, palpitations and leg swelling.  Gastrointestinal:  Negative for nausea, vomiting, abdominal pain, diarrhea, constipation, blood in stool, abdominal distention and rectal pain.  Endocrine: Negative for polydipsia and polyuria.  Genitourinary: Negative for dysuria, hematuria and testicular pain.  Musculoskeletal: Negative for joint swelling and arthralgias.  Skin: Negative for rash.  Neurological: Negative for dizziness, syncope, weakness, light-headedness and headaches.  Hematological: Negative for adenopathy.  Psychiatric/Behavioral: Negative for confusion and dysphoric mood.       Objective:   Physical Exam  Constitutional: He is oriented to person, place, and time. He appears well-developed and well-nourished. No distress.  HENT:  Head: Normocephalic and atraumatic.  Right Ear: External ear normal.  Left Ear: External ear normal.  Mouth/Throat: Oropharynx is clear and moist.  Eyes: Conjunctivae and EOM are normal. Pupils are equal, round, and reactive to light.  Neck: Normal range of motion. Neck supple. No thyromegaly present.  Cardiovascular: Normal rate.   No murmur heard. Occasional premature beat  Pulmonary/Chest: No respiratory distress. He has no wheezes. He has no rales.  Abdominal: Soft. Bowel sounds are normal. He exhibits no distension and no mass. There is no tenderness. There is no rebound and no guarding.  Musculoskeletal: He exhibits no edema.  Lymphadenopathy:    He has no cervical adenopathy.  Neurological: He is alert and oriented to person, place, and time. He displays normal reflexes. No cranial nerve deficit.  Skin: No rash noted.  Psychiatric: He has a normal mood and affect.          Assessment & Plan:  Physical exam. Patient has history of prostate cancer with prostatectomy over 5 years ago. Followed still by urology. Obtain screening labs. Continue  yearly flu vaccine. Other immunizations up-to-date. Colonoscopy up-to-date. We discussed importance of regular weightbearing exercise and upper and lower  body strengthening.

## 2016-01-03 NOTE — Addendum Note (Signed)
Addended by: Elio Forget on: 01/03/2016 10:48 AM   Modules accepted: Orders, SmartSet

## 2016-01-03 NOTE — Patient Instructions (Signed)
Dupuytren's Contracture Dupuytren's contracture affects the fingers and the palm of the hand. This condition usually develops slowly. It may take many years to develop. The pinky finger and the ring finger are most often affected. These fingers start to curve inward, like a claw. At some point, the fingers cannot go straight anymore. This can make it hard to do things like:  Put on gloves.  Shake hands.  Grab something off a shelf. The condition usually does not cause pain and is not dangerous. The condition gets its name from the doctor who came up with an operation to fix the problem. His name was Baron Guillaume Dupuytren. Contracture means pulling inward. CAUSES  Dupuytren's contracture does not start with the fingers. It starts in the palm of the hand, under the skin. The tissue under the skin is called fascia. The fascia covers the cords (tendons) that control how the fingers move. In Dupuytren's contracture the fascia tissue becomes thick and then pulls on the cords. That causes the fingers to curl. The condition can affect both hands and any fingers, but it usually strikes one hand worse than the other. The fingers farthest from the thumb are most often the ones that curl. The cause is not clear. Some experts believe it results from an autoimmune reaction. That means the body's immune system (which fights off disease) attacks itself by mistake. What experts do know is that certain conditions and behaviors (called risk factors) make the chance of having this condition more likely. They include:  Age. Most people who have the condition are older than 50.  Sex. It affects men more often than women.  Family history. The condition tends to run in families from countries in [place].  Certain behaviors. People who smoke and drink alcohol are more apt to develop the problem.  Some other medical conditions. Having diabetes makes Dupuytren's contracture more likely. So does  having a condition that involves a seizure (when the brain's function is interrupted). SYMPTOMS  Signs of this condition take time to develop. Sometimes this takes weeks or months. More often, it takes several years.   Early symptoms:  Skin on the palm of the hand becomes thick. This is usually the first sign.  The skin may look dimpled or puckered.  Lumps (nodules) show up on the palm. There may be one or more lumps. They are not painful.  Later symptoms:  Thick cords of tissue form in the palm of the hand.  The pinky and ring fingers start to curl up into the palm.  The fingers cannot be straightened into their normal position. DIAGNOSIS  A physical examination is the main way that a healthcare provider can tell if you have Dupuytren's contracture. Other tests usually are not needed. The caregiver will probably:  Look at your hands. Feel your hands. This is to check for thickening and nodules.  Measure finger motion. This tells how much your fingers have contracted (pulled in).  Do a tabletop test. You will be asked to try to put your hand flat on a table, palm down. TREATMENT  There is no cure for Dupuytren's contracture. But there are ways to treat the symptoms. Options include:  Watching and waiting. The condition develops slowly. Often it does not create problems for a long time. Sometimes the skin gets thick and nodules form, but the fingers never curl. So, in some cases it is best to just watch the condition carefully and wait to see what happens.    for a long time. Sometimes the skin gets thick and nodules form, but the fingers never curl. So, in some cases it is best to just watch the condition carefully and wait to see what happens.  · Shots (injections). Different substances may be injected, including:    Steroids. These drugs block swelling. These shots should make the condition less uncomfortable. Steroids may also slow down the condition. Shots are given into the nodules. The effect only lasts awhile. More shots may have to be given.    Enzymes. These are proteins. They weaken the thick tissue. After an injection, the caregiver usually stretches the  fingers.  · Needling. A needle is pushed through the skin and into the thick tissue. This is done in several spots. The goal is to break up the thickened tissue. Or to weaken it.  · Surgery. This may be suggested if you cannot grasp objects. Or, if you can no longer put your hand in your pocket.    A cut (incision) is made in the palm of the hand. The thick tissue is removed.    Sometimes the thick tissue is attached to the skin. Then, the skin must be removed, too. It is replaced with a piece of skin from another place on your body. That is called a skin graft.    Occupational or hand therapy is almost always needed after surgery. This involves special exercises to get back the use of your hand and fingers. After a skin graft, several months of therapy may be needed.    Sometimes the condition comes back, even after surgery.  · Other methods. You can do some things on your own. They include:    Stretching the fingers backwards. Do this often.    Warming the hand and massaging it. Again, do this often.    Using tools with padded grips. This should make things easier.    Wearing heavy gloves while working. This protects the hands.  PROGNOSIS   Dupuytren's contracture usually develops slowly. There is no cure. But, the symptoms can be treated. Sometimes they come back after treatment, but not always. It is important to remember that this is a functional problem and not a life-threatening condition.     This information is not intended to replace advice given to you by your health care provider. Make sure you discuss any questions you have with your health care provider.     Document Released: 07/15/2009 Document Revised: 10/08/2014 Document Reviewed: 07/15/2009  Elsevier Interactive Patient Education ©2016 [employer]

## 2016-01-03 NOTE — Progress Notes (Signed)
Pre visit review using our clinic review tool, if applicable. No additional management support is needed unless otherwise documented below in the visit note. 

## 2016-01-04 ENCOUNTER — Encounter: Payer: Self-pay | Admitting: Family Medicine

## 2016-04-15 ENCOUNTER — Observation Stay (HOSPITAL_COMMUNITY)
Admission: EM | Admit: 2016-04-15 | Discharge: 2016-04-17 | Disposition: A | Discharge status: Home / Self Care | Payer: PPO | Attending: Internal Medicine | Admitting: Internal Medicine

## 2016-04-15 ENCOUNTER — Emergency Department (HOSPITAL_COMMUNITY): Payer: PPO

## 2016-04-15 ENCOUNTER — Encounter (HOSPITAL_COMMUNITY): Payer: Self-pay | Admitting: Oncology

## 2016-04-15 DIAGNOSIS — I429 Cardiomyopathy, unspecified: Secondary | ICD-10-CM | POA: Diagnosis not present

## 2016-04-15 DIAGNOSIS — R509 Fever, unspecified: Secondary | ICD-10-CM | POA: Diagnosis present

## 2016-04-15 DIAGNOSIS — R6510 Systemic inflammatory response syndrome (SIRS) of non-infectious origin without acute organ dysfunction: Secondary | ICD-10-CM | POA: Diagnosis present

## 2016-04-15 DIAGNOSIS — Z79899 Other long term (current) drug therapy: Secondary | ICD-10-CM | POA: Insufficient documentation

## 2016-04-15 DIAGNOSIS — W57XXXA Bitten or stung by nonvenomous insect and other nonvenomous arthropods, initial encounter: Secondary | ICD-10-CM | POA: Insufficient documentation

## 2016-04-15 DIAGNOSIS — Z8546 Personal history of malignant neoplasm of prostate: Secondary | ICD-10-CM | POA: Insufficient documentation

## 2016-04-15 DIAGNOSIS — S30861A Insect bite (nonvenomous) of abdominal wall, initial encounter: Secondary | ICD-10-CM | POA: Diagnosis present

## 2016-04-15 DIAGNOSIS — T148 Other injury of unspecified body region: Secondary | ICD-10-CM

## 2016-04-15 DIAGNOSIS — Z7982 Long term (current) use of aspirin: Secondary | ICD-10-CM | POA: Diagnosis not present

## 2016-04-15 DIAGNOSIS — Z87891 Personal history of nicotine dependence: Secondary | ICD-10-CM | POA: Diagnosis not present

## 2016-04-15 LAB — CBC WITH DIFFERENTIAL/PLATELET
Basophils Absolute: 0 K/uL (ref 0.0–0.1)
Basophils Relative: 0 %
Eosinophils Absolute: 0 K/uL (ref 0.0–0.7)
Eosinophils Relative: 0 %
HCT: 40.9 % (ref 39.0–52.0)
Hemoglobin: 13.7 g/dL (ref 13.0–17.0)
Lymphocytes Relative: 8 %
Lymphs Abs: 0.9 K/uL (ref 0.7–4.0)
MCH: 30.1 pg (ref 26.0–34.0)
MCHC: 33.5 g/dL (ref 30.0–36.0)
MCV: 89.9 fL (ref 78.0–100.0)
Monocytes Absolute: 0.8 K/uL (ref 0.1–1.0)
Monocytes Relative: 7 %
Neutro Abs: 10.1 K/uL — ABNORMAL HIGH (ref 1.7–7.7)
Neutrophils Relative %: 85 %
Platelets: 149 K/uL — ABNORMAL LOW (ref 150–400)
RBC: 4.55 MIL/uL (ref 4.22–5.81)
RDW: 13.9 % (ref 11.5–15.5)
WBC: 11.7 K/uL — ABNORMAL HIGH (ref 4.0–10.5)

## 2016-04-15 LAB — URINALYSIS, ROUTINE W REFLEX MICROSCOPIC
Bilirubin Urine: NEGATIVE
Glucose, UA: NEGATIVE mg/dL
Hgb urine dipstick: NEGATIVE
Ketones, ur: NEGATIVE mg/dL
Leukocytes, UA: NEGATIVE
Nitrite: NEGATIVE
Protein, ur: NEGATIVE mg/dL
Specific Gravity, Urine: 1.023 (ref 1.005–1.030)
pH: 5.5 (ref 5.0–8.0)

## 2016-04-15 LAB — COMPREHENSIVE METABOLIC PANEL WITH GFR
ALT: 17 U/L (ref 17–63)
AST: 26 U/L (ref 15–41)
Albumin: 3.9 g/dL (ref 3.5–5.0)
Alkaline Phosphatase: 43 U/L (ref 38–126)
Anion gap: 10 (ref 5–15)
BUN: 17 mg/dL (ref 6–20)
CO2: 21 mmol/L — ABNORMAL LOW (ref 22–32)
Calcium: 8.6 mg/dL — ABNORMAL LOW (ref 8.9–10.3)
Chloride: 100 mmol/L — ABNORMAL LOW (ref 101–111)
Creatinine, Ser: 1.09 mg/dL (ref 0.61–1.24)
GFR calc Af Amer: >60 mL/min (ref >60)
GFR calc non Af Amer: >60 mL/min (ref >60)
Glucose, Bld: 141 mg/dL — ABNORMAL HIGH (ref 65–99)
Potassium: 3.7 mmol/L (ref 3.5–5.1)
Sodium: 131 mmol/L — ABNORMAL LOW (ref 135–145)
Total Bilirubin: 1.1 mg/dL (ref 0.3–1.2)
Total Protein: 7.2 g/dL (ref 6.5–8.1)

## 2016-04-15 LAB — I-STAT CG4 LACTIC ACID, ED: Lactic Acid, Venous: 2.76 mmol/L (ref 0.5–1.9)

## 2016-04-15 MED ORDER — ASPIRIN EC 81 MG PO TBEC
81.0000 mg | DELAYED_RELEASE_TABLET | Freq: Every day | ORAL | Status: DC
  Administered 2016-04-15 – 2016-04-17 (×3): 81 mg via ORAL
  Filled 2016-04-15 (×3): qty 1

## 2016-04-15 MED ORDER — ACETAMINOPHEN 325 MG PO TABS: 650.0000 mg | ORAL_TABLET | Freq: Once | ORAL | Status: DC | PRN

## 2016-04-15 MED ORDER — SODIUM CHLORIDE 0.9 % IV BOLUS (SEPSIS)
1000.0000 mL | Freq: Once | INTRAVENOUS | Status: AC
  Administered 2016-04-15: 1000 mL via INTRAVENOUS

## 2016-04-15 MED ORDER — LISINOPRIL 5 MG PO TABS
5.0000 mg | ORAL_TABLET | Freq: Every day | ORAL | Status: DC
  Administered 2016-04-16 – 2016-04-17 (×2): 5 mg via ORAL
  Filled 2016-04-15 (×2): qty 1

## 2016-04-15 MED ORDER — ACETAMINOPHEN 325 MG PO TABS
650.0000 mg | ORAL_TABLET | Freq: Four times a day (QID) | ORAL | Status: DC | PRN
  Administered 2016-04-16 – 2016-04-17 (×3): 650 mg via ORAL
  Filled 2016-04-15 (×3): qty 2

## 2016-04-15 MED ORDER — DOXYCYCLINE HYCLATE 100 MG PO TABS
100.0000 mg | ORAL_TABLET | Freq: Two times a day (BID) | ORAL | Status: DC
  Administered 2016-04-15 – 2016-04-17 (×4): 100 mg via ORAL
  Filled 2016-04-15 (×4): qty 1

## 2016-04-15 MED ORDER — ENOXAPARIN SODIUM 40 MG/0.4ML ~~LOC~~ SOLN
40.0000 mg | Freq: Every day | SUBCUTANEOUS | Status: DC
  Administered 2016-04-15 – 2016-04-16 (×2): 40 mg via SUBCUTANEOUS
  Filled 2016-04-15 (×2): qty 0.4

## 2016-04-15 MED ORDER — ADULT MULTIVITAMIN W/MINERALS CH
1.0000 | ORAL_TABLET | Freq: Every day | ORAL | Status: DC
  Administered 2016-04-16 – 2016-04-17 (×2): 1 via ORAL
  Filled 2016-04-15 (×2): qty 1

## 2016-04-15 MED ORDER — ACETAMINOPHEN 325 MG PO TABS
650.0000 mg | ORAL_TABLET | Freq: Once | ORAL | Status: AC
  Administered 2016-04-15: 650 mg via ORAL
  Filled 2016-04-15: qty 2

## 2016-04-15 NOTE — H&P (Signed)
History and Physical    Mark Pruitt [id number] DOB: June 24, 1941 DOA: 04/15/2016   PCP: Eulas Post, MD Chief Complaint:  Chief Complaint  Patient presents with  . Fever    HPI: Mark Pruitt is a 75 y.o. male with medical history significant of tick bite x2 some 6 weeks ago to his groin area.  This was followed by prolonged erythema and slow healing of the wound site.  Rash finally did heal.  Yesterday he developed fever, nausea, chills, generalized body aches, mild headache, mild sore throat.  No CP, SOB, abd pain, dysuria, or rash at this time.  Fever at home he measured as high as 104.5 yesterday.  He presents to the ED where his Tm is 101.5  ED Course: Work up thus far is unremarkable, UA, CXR, are neg, BCx pending.  Review of Systems: As per HPI otherwise 10 point review of systems negative.    Past Medical History  Diagnosis Date  . COLONIC POLYPS 07/25/2009  . ELEVATED PROSTATE SPECIFIC ANTIGEN 07/27/2009  . HYPERTROPHY PROSTATE W/UR OBST & OTH LUTS 07/25/2009  . LICHEN SIMPLEX CHRONICUS 02/03/2009  . Other primary cardiomyopathies 08/08/2010  . Irregular heart beat   . Hearing loss of both ears     Past Surgical History  Procedure Laterality Date  . Prostate surgery       reports that he quit smoking about 32 years ago. His smoking use included Cigarettes. He has a 20 pack-year smoking history. He has never used smokeless tobacco. He reports that he does not drink alcohol or use illicit drugs.  No Known Allergies  Family History  Problem Relation Age of Onset  . Arthritis Neg Hx     family hx  . Colon cancer Neg Hx   . Cancer Mother     parathyroid adenoma  . Anuerysm Father 3    abdominal      Prior to Admission medications   Medication Sig Start Date End Date Taking? Authorizing Provider  aspirin EC 81 MG tablet Take 81 mg by mouth daily.   Yes Historical Provider, MD  Bioflavonoid Products (ESTER-C) TABS Take 1 tablet by mouth  daily.   Yes Historical Provider, MD  Flaxseed, Linseed, (FLAXSEED OIL) 1000 MG CAPS Take 1,000 mg by mouth daily.   Yes Historical Provider, MD  lisinopril (PRINIVIL,ZESTRIL) 5 MG tablet Take 1 tablet (5 mg total) by mouth daily. 01/03/16  Yes Eulas Post, MD  Multiple Vitamin (MULTIVITAMIN WITH MINERALS) TABS tablet Take 1 tablet by mouth daily.   Yes Historical Provider, MD  triamcinolone cream (KENALOG) 0.1 % Apply 1 application topically 2 (two) times daily as needed (for itching/irritation).   Yes Historical Provider, MD    Physical Exam: Filed Vitals:   04/15/16 1959 04/15/16 2000 04/15/16 2100  BP: 113/72 113/72 102/73  Pulse: 106 105 94  Temp: 101.5 F (38.6 C)    TempSrc: Oral    Resp: 18  24  SpO2: 96% 95% 97%      Constitutional: NAD, calm, comfortable Eyes: PERRL, lids and conjunctivae normal ENMT: Mucous membranes are moist. Posterior pharynx clear of any exudate or lesions.Normal dentition.  Neck: normal, supple, no masses, no thyromegaly Respiratory: clear to auscultation bilaterally, no wheezing, no crackles. Normal respiratory effort. No accessory muscle use.  Cardiovascular: Regular rate and rhythm, no murmurs / rubs / gallops. No extremity edema. 2+ pedal pulses. No carotid bruits.  Abdomen: no tenderness, no masses palpated. No hepatosplenomegaly. Bowel sounds positive.  Musculoskeletal: no clubbing / cyanosis. No joint deformity upper and lower extremities. Good ROM, no contractures. Normal muscle tone.  Skin: no rashes, lesions, ulcers. No induration Neurologic: CN 2-12 grossly intact. Sensation intact, DTR normal. Strength 5/5 in all 4.  Psychiatric: Normal judgment and insight. Alert and oriented x 3. Normal mood.    Labs on Admission: I have personally reviewed following labs and imaging studies  CBC:  Recent Labs Lab 04/15/16 2111  WBC 11.7*  NEUTROABS 10.1*  HGB 13.7  HCT 40.9  MCV 89.9  PLT 123456*   Basic Metabolic Panel:  Recent  Labs Lab 04/15/16 2111  NA 131*  K 3.7  CL 100*  CO2 21*  GLUCOSE 141*  BUN 17  CREATININE 1.09  CALCIUM 8.6*   GFR: CrCl cannot be calculated (Unknown ideal weight.). Liver Function Tests:  Recent Labs Lab 04/15/16 2111  AST 26  ALT 17  ALKPHOS 43  BILITOT 1.1  PROT 7.2  ALBUMIN 3.9   No results for input(s): LIPASE, AMYLASE in the last 168 hours. No results for input(s): AMMONIA in the last 168 hours. Coagulation Profile: No results for input(s): INR, PROTIME in the last 168 hours. Cardiac Enzymes: No results for input(s): CKTOTAL, CKMB, CKMBINDEX, TROPONINI in the last 168 hours. BNP (last 3 results) No results for input(s): PROBNP in the last 8760 hours. HbA1C: No results for input(s): HGBA1C in the last 72 hours. CBG: No results for input(s): GLUCAP in the last 168 hours. Lipid Profile: No results for input(s): CHOL, HDL, LDLCALC, TRIG, CHOLHDL, LDLDIRECT in the last 72 hours. Thyroid Function Tests: No results for input(s): TSH, T4TOTAL, FREET4, T3FREE, THYROIDAB in the last 72 hours. Anemia Panel: No results for input(s): VITAMINB12, FOLATE, FERRITIN, TIBC, IRON, RETICCTPCT in the last 72 hours. Urine analysis:    Component Value Date/Time   COLORURINE YELLOW 04/15/2016 2207   APPEARANCEUR CLEAR 04/15/2016 2207   LABSPEC 1.023 04/15/2016 2207   PHURINE 5.5 04/15/2016 2207   GLUCOSEU NEGATIVE 04/15/2016 2207   HGBUR NEGATIVE 04/15/2016 2207   BILIRUBINUR NEGATIVE 04/15/2016 2207   BILIRUBINUR n 11/14/2011 1357   KETONESUR NEGATIVE 04/15/2016 2207   PROTEINUR NEGATIVE 04/15/2016 2207   PROTEINUR n 11/14/2011 1357   UROBILINOGEN 0.2 11/14/2011 1357   NITRITE NEGATIVE 04/15/2016 2207   NITRITE n 11/14/2011 1357   LEUKOCYTESUR NEGATIVE 04/15/2016 2207   Sepsis Labs: @LABRCNTIP (procalcitonin:4,lacticidven:4) )No results found for this or any previous visit (from the past 240 hour(s)).   Radiological Exams on Admission: Dg Chest 2 View  04/15/2016   CLINICAL DATA:  Fever, nausea, and chills EXAM: CHEST  2 VIEW COMPARISON:  November 21, 2009 FINDINGS: The heart size and mediastinal contours are within normal limits. Both lungs are clear. The visualized skeletal structures are unremarkable. IMPRESSION: No active cardiopulmonary disease. Electronically Signed   By: Dorise Bullion III M.D   On: 04/15/2016 22:31    EKG: Independently reviewed.  Assessment/Plan Principal Problem:   SIRS (systemic inflammatory response syndrome) (HCC) Active Problems:   Tick bite   Fever   SIRS - with history of tick bite 3 weeks ago and prolonged rash after  Despite that the description of the rash dosent classically describe erythema migrans, Lyme is still a suspicion.  Alternatively he could have a viral infection.  BCx pending  Lyme, RMSF (which I doubt), and Erychlia are all pending  Will empirically treat with doxycycline  Tylenol PRN fever   DVT prophylaxis: Lovenox Code Status: Full Family Communication: Family at  bedside Consults called: None Admission status: Admit to obs   Etta Quill DO [facility] Pager [phone] from 7PM-7AM  If 7AM-7PM, please contact the day physician for the patient [email or web address] Password [email or web address]  04/15/2016, 11:18 PM

## 2016-04-15 NOTE — ED Provider Notes (Signed)
CSN: [id number]     Arrival date & time 04/15/16  1951 History   First MD Initiated Contact with Patient 04/15/16 2021     Chief Complaint  Patient presents with  . Fever     Patient is a 75 y.o. male presenting with fever. The history is provided by the patient. No language interpreter was used.  Fever  Mark Pruitt is a 75 y.o. male who presents to the Emergency Department complaining of Fever. He reports fever and chills since yesterday. He was febrile to 104 at home today. He reports nausea, generalized body aches. He is a mild sore throat and mild headache. No known sick contacts. No cough, shortness of breath, chest pain, abdominal pain, vomiting, diarrhea, dysuria. He had tick bites 6 weeks ago, none recently. No foreign travel. No prior similar symptoms. He took aspirin at home for the fever without any improvement in his symptoms.  Past Medical History  Diagnosis Date  . COLONIC POLYPS 07/25/2009  . ELEVATED PROSTATE SPECIFIC ANTIGEN 07/27/2009  . HYPERTROPHY PROSTATE W/UR OBST & OTH LUTS 07/25/2009  . LICHEN SIMPLEX CHRONICUS 02/03/2009  . Other primary cardiomyopathies 08/08/2010  . Irregular heart beat   . Hearing loss of both ears    Past Surgical History  Procedure Laterality Date  . Prostate surgery     Family History  Problem Relation Age of Onset  . Arthritis Neg Hx     family hx  . Colon cancer Neg Hx   . Cancer Mother     parathyroid adenoma  . Anuerysm Father 59    abdominal   Social History  Substance Use Topics  . Smoking status: Former Smoker -- 1.00 packs/day for 20 years    Types: Cigarettes    Quit date: 10/22/1983  . Smokeless tobacco: Never Used  . Alcohol Use: No    Review of Systems  Constitutional: Positive for fever.  All other systems reviewed and are negative.     Allergies  Review of patient's allergies indicates no known allergies.  Home Medications   Prior to Admission medications   Medication Sig Start Date End Date  Taking? Authorizing Provider  aspirin EC 81 MG tablet Take 81 mg by mouth daily.   Yes Historical Provider, MD  Bioflavonoid Products (ESTER-C) TABS Take 1 tablet by mouth daily.   Yes Historical Provider, MD  Flaxseed, Linseed, (FLAXSEED OIL) 1000 MG CAPS Take 1,000 mg by mouth daily.   Yes Historical Provider, MD  lisinopril (PRINIVIL,ZESTRIL) 5 MG tablet Take 1 tablet (5 mg total) by mouth daily. 01/03/16  Yes Eulas Post, MD  Multiple Vitamin (MULTIVITAMIN WITH MINERALS) TABS tablet Take 1 tablet by mouth daily.   Yes Historical Provider, MD  triamcinolone cream (KENALOG) 0.1 % Apply 1 application topically 2 (two) times daily as needed (for itching/irritation).   Yes Historical Provider, MD   BP 115/75 mmHg  Pulse 99  Temp(Src) 99.1 F (37.3 C) (Oral)  Resp 18  Ht 6' (1.829 m)  Wt 186 lb (84.369 kg)  BMI 25.22 kg/m2  SpO2 99% Physical Exam  Constitutional: He is oriented to person, place, and time. He appears well-developed and well-nourished.  HENT:  Head: Normocephalic and atraumatic.  Mouth/Throat: Oropharynx is clear and moist. No oropharyngeal exudate.  Neck: Neck supple.  Cardiovascular: Regular rhythm.   No murmur heard. Tachycardiac  Pulmonary/Chest: Effort normal and breath sounds normal. No respiratory distress.  Abdominal: Soft. There is no tenderness. There is no rebound and no  guarding.  Musculoskeletal: He exhibits no edema or tenderness.  Neurological: He is alert and oriented to person, place, and time.  Skin: Skin is warm and dry.  Psychiatric: He has a normal mood and affect. His behavior is normal.  Nursing note and vitals reviewed.   ED Course  Procedures (including critical care time) Labs Review Labs Reviewed  COMPREHENSIVE METABOLIC PANEL - Abnormal; Notable for the following:    Sodium 131 (*)    Chloride 100 (*)    CO2 21 (*)    Glucose, Bld 141 (*)    Calcium 8.6 (*)    All other components within normal limits  CBC WITH  DIFFERENTIAL/PLATELET - Abnormal; Notable for the following:    WBC 11.7 (*)    Platelets 149 (*)    Neutro Abs 10.1 (*)    All other components within normal limits  I-STAT CG4 LACTIC ACID, ED - Abnormal; Notable for the following:    Lactic Acid, Venous 2.76 (*)    All other components within normal limits  CULTURE, BLOOD (ROUTINE X 2)  CULTURE, BLOOD (ROUTINE X 2)  URINE CULTURE  URINALYSIS, ROUTINE W REFLEX MICROSCOPIC (NOT AT [facility])  B. BURGDORFI ANTIBODIES  EHRLICHIA ANTIBODY PANEL  ROCKY MTN SPOTTED FVR ABS PNL(IGG+IGM)  I-STAT CG4 LACTIC ACID, ED    Imaging Review Dg Chest 2 View  04/15/2016  CLINICAL DATA:  Fever, nausea, and chills EXAM: CHEST  2 VIEW COMPARISON:  November 21, 2009 FINDINGS: The heart size and mediastinal contours are within normal limits. Both lungs are clear. The visualized skeletal structures are unremarkable. IMPRESSION: No active cardiopulmonary disease. Electronically Signed   By: Dorise Bullion III M.D   On: 04/15/2016 22:31   I have personally reviewed and evaluated these images and lab results as part of my medical decision-making.   EKG Interpretation   Date/Time:  Sunday April 15 2016 20:52:48 EDT Ventricular Rate:  92 PR Interval:    QRS Duration: 102 QT Interval:  349 QTC Calculation: 432 R Axis:   -11 Text Interpretation:  Sinus rhythm Atrial premature complex Confirmed by  Hazle Coca [phone]) on 04/15/2016 9:54:50 PM      MDM   Final diagnoses:  Febrile illness    Patient here for evaluation of fever and chills since yesterday. He is nontoxic appearing on examination. Chest x-ray with no evidence of pneumonia UA without any evidence of UTI. The rash exam. Plan to admit for observation given his constellation of symptoms.    Quintella Reichert, MD 04/16/16 [phone]

## 2016-04-15 NOTE — ED Notes (Signed)
Pt c/o fever, nausea, chills and generalized body aches since yesterday.  Pt has been taking OTC fever/pain reliever w/o significant improvement.  Pt took 2 324 mg asa at 1900.  Pt is still febrile in triage.

## 2016-04-16 DIAGNOSIS — Z8546 Personal history of malignant neoplasm of prostate: Secondary | ICD-10-CM | POA: Diagnosis not present

## 2016-04-16 DIAGNOSIS — R6510 Systemic inflammatory response syndrome (SIRS) of non-infectious origin without acute organ dysfunction: Secondary | ICD-10-CM | POA: Diagnosis not present

## 2016-04-16 DIAGNOSIS — R509 Fever, unspecified: Secondary | ICD-10-CM | POA: Diagnosis not present

## 2016-04-16 LAB — I-STAT CG4 LACTIC ACID, ED: Lactic Acid, Venous: 0.95 mmol/L (ref 0.5–1.9)

## 2016-04-16 NOTE — Progress Notes (Signed)
PROGRESS NOTE  Mark Pruitt [id number] DOB: [birth year] DOA: 04/15/2016 PCP: Eulas Post, MD  HPI/Recap of past 24 hours: Mark Pruitt is a 75 y.o. male with medical history significant of tick bite x2 some 6 weeks ago to his groin area. This was followed by prolonged erythema and slow healing of the wound site. Rash finally did heal. On 7/15 he developed fever, nausea, chills, generalized body aches, mild headache, mild sore throat. No CP, SOB, abd pain, dysuria, or rash at this time. Fever at home he measured as high as 104.5, had another fever this morning after admission to the hospital.  Assessment/Plan: SIRS - with history of tick bite 3 weeks ago and prolonged rash after Despite that the description of the rash dosent classically describe erythema migrans, Lyme is  still a suspicion. Alternatively he could have a viral infection. BCx pending Lyme, RMSF (which I doubt), and Erychlia are all pending Will empirically treat with doxycycline Tylenol PRN fever  ID consult if fevers persist and labs are unrevealing.  Code Status: FULL   Family Communication: None present this AM.   Disposition Plan: Likely home once he is afebrile, await serologies.    Consultants:  None   Procedures:  None   Antimicrobials:  Doxy PO started 7/16    Objective: Filed Vitals:   04/15/16 2335 04/16/16 0100 04/16/16 0529 04/16/16 0641  BP: 110/74 115/75 111/67   Pulse: 96 99 107   Temp:  99.1 F (37.3 C) 102.4 F (39.1 C) 101 F (38.3 C)  TempSrc:  Oral Oral Oral  Resp: 20 18 18    Height:  6' (1.829 m)    Weight:  84.369 kg (186 lb)    SpO2: 98% 99% 96%     Intake/Output Summary (Last 24 hours) at 04/16/16 1027 Last data filed at 04/16/16 0529  Gross per 24 hour  Intake    240 ml  Output      0 ml  Net    240 ml   Filed Weights   04/16/16 0100  Weight: 84.369 kg (186 lb)     Exam: General:  Alert, oriented, calm, in no acute distress Eyes: pupils round and reactive to light and accomodation, clear sclerea Neck: supple, no masses, trachea mildline  Cardiovascular: RRR, no murmurs or rubs, no peripheral edema  Respiratory: clear to auscultation bilaterally, no wheezes, no crackles  Abdomen: soft, nontender, nondistended, normal bowel tones heard  Skin: dry, no rashes  Musculoskeletal: no joint effusions, normal range of motion  Psychiatric: appropriate affect, normal speech  Neurologic: extraocular muscles intact, clear speech, moving all extremities with intact sensorium    Data Reviewed: CBC:  Recent Labs Lab 04/15/16 2111  WBC 11.7*  NEUTROABS 10.1*  HGB 13.7  HCT 40.9  MCV 89.9  PLT 123456*   Basic Metabolic Panel:  Recent Labs Lab 04/15/16 2111  NA 131*  K 3.7  CL 100*  CO2 21*  GLUCOSE 141*  BUN 17  CREATININE 1.09  CALCIUM 8.6*   GFR: Estimated Creatinine Clearance: 63.3 mL/min (by C-G formula based on Cr of 1.09). Liver Function Tests:  Recent Labs Lab 04/15/16 2111  AST 26  ALT 17  ALKPHOS 43  BILITOT 1.1  PROT 7.2  ALBUMIN 3.9   No results for input(s): LIPASE, AMYLASE in the last 168 hours. No results for input(s): AMMONIA in the last 168 hours. Coagulation Profile: No results for input(s): INR, PROTIME in the last 168 hours. Cardiac Enzymes: No results  for input(s): CKTOTAL, CKMB, CKMBINDEX, TROPONINI in the last 168 hours. BNP (last 3 results) No results for input(s): PROBNP in the last 8760 hours. HbA1C: No results for input(s): HGBA1C in the last 72 hours. CBG: No results for input(s): GLUCAP in the last 168 hours. Lipid Profile: No results for input(s): CHOL, HDL, LDLCALC, TRIG, CHOLHDL, LDLDIRECT in the last 72 hours. Thyroid Function Tests: No results for input(s): TSH, T4TOTAL, FREET4, T3FREE, THYROIDAB in the last 72 hours. Anemia Panel: No results for input(s): VITAMINB12, FOLATE, FERRITIN,  TIBC, IRON, RETICCTPCT in the last 72 hours. Urine analysis:    Component Value Date/Time   COLORURINE YELLOW 04/15/2016 2207   APPEARANCEUR CLEAR 04/15/2016 2207   LABSPEC 1.023 04/15/2016 2207   PHURINE 5.5 04/15/2016 2207   GLUCOSEU NEGATIVE 04/15/2016 2207   HGBUR NEGATIVE 04/15/2016 2207   BILIRUBINUR NEGATIVE 04/15/2016 2207   BILIRUBINUR n 11/14/2011 1357   KETONESUR NEGATIVE 04/15/2016 2207   PROTEINUR NEGATIVE 04/15/2016 2207   PROTEINUR n 11/14/2011 1357   UROBILINOGEN 0.2 11/14/2011 1357   NITRITE NEGATIVE 04/15/2016 2207   NITRITE n 11/14/2011 1357   LEUKOCYTESUR NEGATIVE 04/15/2016 2207   Sepsis Labs: @LABRCNTIP (procalcitonin:4,lacticidven:4)  )No results found for this or any previous visit (from the past 240 hour(s)).    Studies: Dg Chest 2 View  04/15/2016  CLINICAL DATA:  Fever, nausea, and chills EXAM: CHEST  2 VIEW COMPARISON:  November 21, 2009 FINDINGS: The heart size and mediastinal contours are within normal limits. Both lungs are clear. The visualized skeletal structures are unremarkable. IMPRESSION: No active cardiopulmonary disease. Electronically Signed   By: Dorise Bullion III M.D   On: 04/15/2016 22:31    Scheduled Meds: . aspirin EC  81 mg Oral Daily  . doxycycline  100 mg Oral BID  . enoxaparin (LOVENOX) injection  40 mg Subcutaneous QHS  . lisinopril  5 mg Oral Daily  . multivitamin with minerals  1 tablet Oral Daily    Continuous Infusions:      Time spent: 24 minutes  Shadee Rathod Marry Guan, MD [facility] Pager [phone]  If 7PM-7AM, please contact night-coverage [email or web address] Password [facility] 04/16/2016, 10:27 AM

## 2016-04-16 NOTE — Consult Note (Addendum)
[facility] for Infectious Disease  Date of Admission:  04/15/2016  Date of Consult:  04/16/2016  Reason for Consult: FUO Referring Physician: Hollice Gong  Impression/Recommendation FUO Would check tick serologies Check EBV, CMV Check RPR, HIV If negative/feever persists check CT chest/abd/pelvis Await BCx  Comment- His hx is non-specific, he does not have findings to suggest tick born illness on his CBC or LFTs.  In his age group, FUO typically represents infection or cancer.  Explained to pt and wife  Thank you so much for this interesting consult,   Bobby Rumpf (pager) [phone] [email or web address]  Mark Pruitt is an 75 y.o. male.  HPI: 75 yo M with hx of prostate CA (~ 6 yrs ago), cardiomyopathy, irregular heart beat, comes to ED 7-16 with hx of tick bite 6 weeks ago. He developed erythema at site (non-targetoid) and a slowly healing wound at bite site.  He developed f/c (up to 104.5), nausea and body aches on 7-15 and came to ED. In ED his temp was 101.5, WBC and UA normal. Plt slightly low at 149 (150-400). Lactate 2.76, LFTs normal.  He was started on doxy.    Past Medical History  Diagnosis Date  . COLONIC POLYPS 07/25/2009  . ELEVATED PROSTATE SPECIFIC ANTIGEN 07/27/2009  . HYPERTROPHY PROSTATE W/UR OBST & OTH LUTS 07/25/2009  . LICHEN SIMPLEX CHRONICUS 02/03/2009  . Other primary cardiomyopathies 08/08/2010  . Irregular heart beat   . Hearing loss of both ears     Past Surgical History  Procedure Laterality Date  . Prostate surgery       No Known Allergies  Medications:  Scheduled: . aspirin EC  81 mg Oral Daily  . doxycycline  100 mg Oral BID  . enoxaparin (LOVENOX) injection  40 mg Subcutaneous QHS  . lisinopril  5 mg Oral Daily  . multivitamin with minerals  1 tablet Oral Daily    Abtx:  Anti-infectives    Start     Dose/Rate Route Frequency Ordered Stop   04/15/16 2315  doxycycline (VIBRA-TABS) tablet 100 mg     100 mg  Oral 2 times daily 04/15/16 2251        Total days of antibiotics: 1 doxy          Social History:  reports that he quit smoking about 32 years ago. His smoking use included Cigarettes. He has a 20 pack-year smoking history. He has never used smokeless tobacco. He reports that he does not drink alcohol or use illicit drugs.  Family History  Problem Relation Age of Onset  . Arthritis Neg Hx     family hx  . Colon cancer Neg Hx   . Cancer Mother     parathyroid adenoma  . Anuerysm Father 16    abdominal    General ROS: he denies change in wt, LAN, diarrhea, dysuria, arthritis or joint swelling, rash, sick exposure, recent wound or injury, see HPI.  He denies new soaps, shampoos, detergents.   Blood pressure 114/57, pulse 110, temperature 100.3 F (37.9 C), temperature source Oral, resp. rate 18, height 6' (1.829 m), weight 84.369 kg (186 lb), SpO2 100 %. General appearance: alert, cooperative and no distress Eyes: negative findings: conjunctivae and sclerae normal and pupils equal, round, reactive to light and accomodation Throat: lips, mucosa, and tongue normal; teeth and gums normal Neck: no adenopathy and supple, symmetrical, trachea midline Lungs: clear to auscultation bilaterally Heart: regular rate and rhythm Abdomen: normal findings: bowel sounds normal, soft, non-tender  and spleen non-palpable Extremities: edema none. he has small hematoma (1 mm) in R groin) Pulses: 3+ dorsalis pedis Skin: Skin color, texture, turgor normal. No rashes or lesions   Results for orders placed or performed during the hospital encounter of 04/15/16 (from the past 48 hour(s))  Comprehensive metabolic panel     Status: Abnormal   Collection Time: 04/15/16  9:11 PM  Result Value Ref Range   Sodium 131 (L) 135 - 145 mmol/L   Potassium 3.7 3.5 - 5.1 mmol/L   Chloride 100 (L) 101 - 111 mmol/L   CO2 21 (L) 22 - 32 mmol/L   Glucose, Bld 141 (H) 65 - 99 mg/dL   BUN 17 6 - 20 mg/dL   Creatinine,  Ser 1.09 0.61 - 1.24 mg/dL   Calcium 8.6 (L) 8.9 - 10.3 mg/dL   Total Protein 7.2 6.5 - 8.1 g/dL   Albumin 3.9 3.5 - 5.0 g/dL   AST 26 15 - 41 U/L   ALT 17 17 - 63 U/L   Alkaline Phosphatase 43 38 - 126 U/L   Total Bilirubin 1.1 0.3 - 1.2 mg/dL   GFR calc non Af Amer >60 >60 mL/min   GFR calc Af Amer >60 >60 mL/min    Comment: (NOTE) The eGFR has been calculated using the CKD EPI equation. This calculation has not been validated in all clinical situations. eGFR's persistently <60 mL/min signify possible Chronic Kidney Disease.    Anion gap 10 5 - 15  CBC WITH DIFFERENTIAL     Status: Abnormal   Collection Time: 04/15/16  9:11 PM  Result Value Ref Range   WBC 11.7 (H) 4.0 - 10.5 K/uL   RBC 4.55 4.22 - 5.81 MIL/uL   Hemoglobin 13.7 13.0 - 17.0 g/dL   HCT 40.9 39.0 - 52.0 %   MCV 89.9 78.0 - 100.0 fL   MCH 30.1 26.0 - 34.0 pg   MCHC 33.5 30.0 - 36.0 g/dL   RDW 13.9 11.5 - 15.5 %   Platelets 149 (L) 150 - 400 K/uL   Neutrophils Relative % 85 %   Neutro Abs 10.1 (H) 1.7 - 7.7 K/uL   Lymphocytes Relative 8 %   Lymphs Abs 0.9 0.7 - 4.0 K/uL   Monocytes Relative 7 %   Monocytes Absolute 0.8 0.1 - 1.0 K/uL   Eosinophils Relative 0 %   Eosinophils Absolute 0.0 0.0 - 0.7 K/uL   Basophils Relative 0 %   Basophils Absolute 0.0 0.0 - 0.1 K/uL  I-Stat CG4 Lactic Acid, ED  (not at  [facility])     Status: Abnormal   Collection Time: 04/15/16  9:25 PM  Result Value Ref Range   Lactic Acid, Venous 2.76 (HH) 0.5 - 1.9 mmol/L   Comment NOTIFIED PHYSICIAN   Urinalysis, Routine w reflex microscopic (not at [facility])     Status: None   Collection Time: 04/15/16 10:07 PM  Result Value Ref Range   Color, Urine YELLOW YELLOW   APPearance CLEAR CLEAR   Specific Gravity, Urine 1.023 1.005 - 1.030   pH 5.5 5.0 - 8.0   Glucose, UA NEGATIVE NEGATIVE mg/dL   Hgb urine dipstick NEGATIVE NEGATIVE   Bilirubin Urine NEGATIVE NEGATIVE   Ketones, ur NEGATIVE NEGATIVE mg/dL   Protein, ur NEGATIVE NEGATIVE  mg/dL   Nitrite NEGATIVE NEGATIVE   Leukocytes, UA NEGATIVE NEGATIVE    Comment: MICROSCOPIC NOT DONE ON URINES WITH NEGATIVE PROTEIN, BLOOD, LEUKOCYTES, NITRITE, OR GLUCOSE <1000 mg/dL.  I-Stat CG4 Lactic  Acid, ED  (not at  [facility])     Status: None   Collection Time: 04/16/16 12:39 AM  Result Value Ref Range   Lactic Acid, Venous 0.95 0.5 - 1.9 mmol/L   No results found for: SDES, SPECREQUEST, CULT, REPTSTATUS Dg Chest 2 View  04/15/2016  CLINICAL DATA:  Fever, nausea, and chills EXAM: CHEST  2 VIEW COMPARISON:  November 21, 2009 FINDINGS: The heart size and mediastinal contours are within normal limits. Both lungs are clear. The visualized skeletal structures are unremarkable. IMPRESSION: No active cardiopulmonary disease. Electronically Signed   By: Dorise Bullion III M.D   On: 04/15/2016 22:31   No results found for this or any previous visit (from the past 240 hour(s)).  04/16/2016, 4:38 PM     Records and images were personally reviewed where available.

## 2016-04-16 NOTE — Progress Notes (Signed)
Date: April 16 2016/Rhonda Eldridge Dace, [place], [place]   [phone]  Abn form explained to patient and signature obtained.

## 2016-04-17 DIAGNOSIS — R6510 Systemic inflammatory response syndrome (SIRS) of non-infectious origin without acute organ dysfunction: Secondary | ICD-10-CM | POA: Diagnosis not present

## 2016-04-17 LAB — BASIC METABOLIC PANEL WITH GFR
Anion gap: 5 (ref 5–15)
BUN: 14 mg/dL (ref 6–20)
CO2: 26 mmol/L (ref 22–32)
Calcium: 8.7 mg/dL — ABNORMAL LOW (ref 8.9–10.3)
Chloride: 105 mmol/L (ref 101–111)
Creatinine, Ser: 1 mg/dL (ref 0.61–1.24)
GFR calc Af Amer: >60 mL/min (ref >60)
GFR calc non Af Amer: >60 mL/min (ref >60)
Glucose, Bld: 104 mg/dL — ABNORMAL HIGH (ref 65–99)
Potassium: 4 mmol/L (ref 3.5–5.1)
Sodium: 136 mmol/L (ref 135–145)

## 2016-04-17 LAB — B. BURGDORFI ANTIBODIES: B burgdorferi Ab IgG+IgM: <0.91 {ISR} (ref 0.00–0.90)

## 2016-04-17 LAB — URINE CULTURE: Culture: 3000 — AB

## 2016-04-17 LAB — MAGNESIUM: Magnesium: 2.1 mg/dL (ref 1.7–2.4)

## 2016-04-17 MED ORDER — DOXYCYCLINE HYCLATE 100 MG PO TABS: 100.0000 mg | ORAL_TABLET | Freq: Two times a day (BID) | ORAL | Status: DC

## 2016-04-17 MED ORDER — DOXYCYCLINE HYCLATE 100 MG IV SOLR: 100.0000 mg | Freq: Two times a day (BID) | INTRAVENOUS | Status: DC

## 2016-04-17 NOTE — Discharge Summary (Signed)
 Discharge Summary  Mark Pruitt [id number] DOB: 29-May-1941  PCP: Eulas Post, MD  Admit date: 04/15/2016 Discharge date: 04/17/2016   Recommendations for Outpatient Follow-up:  1. PCP in 1 week, sooner if fevers persist.   Discharge Diagnoses:  Active Hospital Problems   Diagnosis Date Noted  . SIRS (systemic inflammatory response syndrome) ([facility]) 04/15/2016  . Tick bite 04/15/2016  . Fever 04/15/2016    Resolved Hospital Problems   Diagnosis Date Noted Date Resolved  No resolved problems to display.    Discharge Condition: Stable   Diet recommendation: Regular   Filed Vitals:   04/17/16 0608 04/17/16 0908  BP: 116/77   Pulse: 89   Temp: 98.4 F (36.9 C) 98.6 F (37 C)  Resp: 18     History of present illness:  Mark Pruitt is a 75 y.o. male with medical history significant of tick bite x2 some 6 weeks ago to his groin area. This was followed by prolonged erythema and slow healing of the wound site. Rash finally did heal. On 7/15 he developed fever, nausea, chills, generalized body aches, mild headache, mild sore throat. No CP, SOB, abd pain, dysuria, or rash at this time. Fever at home he measured as high as 104.5, he had fevers for a day after admission.  Hospital Course:  Principal Problem:   SIRS (systemic inflammatory response syndrome) (HCC) Active Problems:   Tick bite   Fever  He had fevers after admission, was started empirically on Doxycycline and seen by ID. He has now not had a fever since 7/17 2pm, he is asymptomatic and ready for discharge home. Discussed with Dr. Johnnye Sima, patient to f/u with PCP to get results of pending labs that include serologies for RMSF, lyme, Erlychia, EBV, CMV, RPR, HIV. If these are negative would consider CT C/A/P to look for occult abscess or malignancy/lymphoma.  Procedures:  None   Consultations:  ID [facility]   Discharge Exam: BP 116/77 mmHg  Pulse 89  Temp(Src) 98.6 F (37 C) (Oral)   Resp 18  Ht 6' (1.829 m)  Wt 84.369 kg (186 lb)  BMI 25.22 kg/m2  SpO2 100% General:  Alert, oriented, calm, in no acute distress  Eyes: pupils round and reactive to light and accomodation, clear sclerea Neck: supple, no masses, trachea mildline  Cardiovascular: RRR, no murmurs or rubs, no peripheral edema  Respiratory: clear to auscultation bilaterally, no wheezes, no crackles  Abdomen: soft, nontender, nondistended, normal bowel tones heard  Skin: dry, no rashes  Musculoskeletal: no joint effusions, normal range of motion  Psychiatric: appropriate affect, normal speech  Neurologic: extraocular muscles intact, clear speech, moving all extremities with intact sensorium    Discharge Instructions You were cared for by a hospitalist during your hospital stay. If you have any questions about your discharge medications or the care you received while you were in the hospital after you are discharged, you can call the unit and asked to speak with the hospitalist on call if the hospitalist that took care of you is not available. Once you are discharged, your primary care physician will handle any further medical issues. Please note that NO REFILLS for any discharge medications will be authorized once you are discharged, as it is imperative that you return to your primary care physician (or establish a relationship with a primary care physician if you do not have one) for your aftercare needs so that they can reassess your need for medications and monitor your lab values.  Medication List    TAKE these medications        aspirin EC 81 MG tablet  Take 81 mg by mouth daily.     doxycycline 100 MG tablet  Commonly known as:  VIBRA-TABS  Take 1 tablet (100 mg total) by mouth 2 (two) times daily.     ESTER-C Tabs  Take 1 tablet by mouth daily.     Flaxseed Oil 1000 MG Caps  Take 1,000 mg by mouth daily.     lisinopril 5 MG tablet  Commonly known as:  PRINIVIL,ZESTRIL  Take 1 tablet (5  mg total) by mouth daily.     multivitamin with minerals Tabs tablet  Take 1 tablet by mouth daily.     triamcinolone cream 0.1 %  Commonly known as:  KENALOG  Apply 1 application topically 2 (two) times daily as needed (for itching/irritation).       No Known Allergies    The results of significant diagnostics from this hospitalization (including imaging, microbiology, ancillary and laboratory) are listed below for reference.    Significant Diagnostic Studies: Dg Chest 2 View  04/15/2016  CLINICAL DATA:  Fever, nausea, and chills EXAM: CHEST  2 VIEW COMPARISON:  November 21, 2009 FINDINGS: The heart size and mediastinal contours are within normal limits. Both lungs are clear. The visualized skeletal structures are unremarkable. IMPRESSION: No active cardiopulmonary disease. Electronically Signed   By: Dorise Bullion III M.D   On: 04/15/2016 22:31    Microbiology: Recent Results (from the past 240 hour(s))  Urine culture     Status: Abnormal   Collection Time: 04/15/16 10:07 PM  Result Value Ref Range Status   Specimen Description URINE, CLEAN CATCH  Final   Special Requests NONE  Final   Culture (A)  Final    3,000 COLONIES/mL INSIGNIFICANT GROWTH Performed at [facility]    Report Status 04/17/2016 FINAL  Final     Labs: Basic Metabolic Panel:  Recent Labs Lab 04/15/16 2111 04/17/16 0800  NA 131* 136  K 3.7 4.0  CL 100* 105  CO2 21* 26  GLUCOSE 141* 104*  BUN 17 14  CREATININE 1.09 1.00  CALCIUM 8.6* 8.7*  MG  --  2.1   Liver Function Tests:  Recent Labs Lab 04/15/16 2111  AST 26  ALT 17  ALKPHOS 43  BILITOT 1.1  PROT 7.2  ALBUMIN 3.9   No results for input(s): LIPASE, AMYLASE in the last 168 hours. No results for input(s): AMMONIA in the last 168 hours. CBC:  Recent Labs Lab 04/15/16 2111  WBC 11.7*  NEUTROABS 10.1*  HGB 13.7  HCT 40.9  MCV 89.9  PLT 149*   Cardiac Enzymes: No results for input(s): CKTOTAL, CKMB, CKMBINDEX,  TROPONINI in the last 168 hours. BNP: BNP (last 3 results) No results for input(s): BNP in the last 8760 hours.  ProBNP (last 3 results) No results for input(s): PROBNP in the last 8760 hours.  CBG: No results for input(s): GLUCAP in the last 168 hours.  Time spent: 20 minutes were spent in preparing this discharge including medication reconciliation, counseling, and coordination of care.  Signed:   [employer]  [facility] 04/17/2016, 10:20 AM

## 2016-04-18 LAB — CMV ANTIBODY, IGG (EIA): CMV Ab - IgG: <0.6 U/mL (ref 0.00–0.59)

## 2016-04-18 LAB — B. BURGDORFI ANTIBODIES: B burgdorferi Ab IgG+IgM: <0.91 {ISR} (ref 0.00–0.90)

## 2016-04-18 LAB — ROCKY MTN SPOTTED FVR ABS PNL(IGG+IGM)
RMSF IgG: POSITIVE — AB
RMSF IgM: 0.69 {index} (ref 0.00–0.89)

## 2016-04-18 LAB — HIV ANTIBODY (ROUTINE TESTING W REFLEX): HIV Screen 4th Generation wRfx: NONREACTIVE

## 2016-04-18 LAB — EPSTEIN-BARR VIRUS VCA ANTIBODY PANEL
EBV Early Antigen Ab, IgG: <9 U/mL (ref 0.0–8.9)
EBV NA IgG: 477 U/mL — ABNORMAL HIGH (ref 0.0–17.9)
EBV VCA IgG: >600 U/mL — ABNORMAL HIGH (ref 0.0–17.9)
EBV VCA IgM: <36 U/mL (ref 0.0–35.9)

## 2016-04-18 LAB — RMSF, IGG, IFA: RMSF, IGG, IFA: <1:64 {titer}

## 2016-04-18 LAB — CMV IGM: CMV IgM: <30 [AU]/ml (ref 0.0–29.9)

## 2016-04-19 LAB — ROCKY MTN SPOTTED FVR ABS PNL(IGG+IGM)
RMSF IgG: POSITIVE — AB
RMSF IgM: 0.58 {index} (ref 0.00–0.89)

## 2016-04-19 LAB — EHRLICHIA ANTIBODY PANEL
E chaffeensis (HGE) Ab, IgG: NEGATIVE
E chaffeensis (HGE) Ab, IgM: NEGATIVE
E. Chaffeensis (HME) IgM Titer: NEGATIVE
E.Chaffeensis (HME) IgG: NEGATIVE

## 2016-04-19 LAB — RMSF, IGG, IFA: RMSF, IGG, IFA: 1:256 {titer} — ABNORMAL HIGH

## 2016-04-20 LAB — EHRLICHIA ANTIBODY PANEL
E chaffeensis (HGE) Ab, IgG: NEGATIVE
E chaffeensis (HGE) Ab, IgM: NEGATIVE
E. Chaffeensis (HME) IgM Titer: NEGATIVE
E.Chaffeensis (HME) IgG: NEGATIVE

## 2016-04-21 LAB — CULTURE, BLOOD (ROUTINE X 2)
Culture: NO GROWTH
Culture: NO GROWTH

## 2016-04-24 ENCOUNTER — Ambulatory Visit (INDEPENDENT_AMBULATORY_CARE_PROVIDER_SITE_OTHER): Payer: PPO | Admitting: Family Medicine

## 2016-04-24 ENCOUNTER — Encounter: Payer: Self-pay | Admitting: Family Medicine

## 2016-04-24 ENCOUNTER — Ambulatory Visit: Payer: PPO | Admitting: Family Medicine

## 2016-04-24 VITALS — BP 120/60 | HR 66 | Temp 97.7°F | Ht 72.0 in | Wt 187.0 lb

## 2016-04-24 DIAGNOSIS — R509 Fever, unspecified: Secondary | ICD-10-CM | POA: Diagnosis not present

## 2016-04-24 NOTE — Progress Notes (Signed)
Subjective:     Patient ID: Mark Pruitt, male   DOB: 07/16/1941, 75 y.o.   MRN: [id number]  HPI Patient seen for hospital follow-up. Generally healthy 75 year old male who presented to hospital on July 16 with fever along with some nausea chills generalized body ache, headache, and mild sore throat. He had given history of preceding tick bites about 6-8 weeks prior to admission. He states one of these was a very small deer tick and the other was a larger tick. He never had any skin rash. Fever was up to 104.5.  Patient was admitted and started empirically on doxycycline. He was seen in consultation by infectious disease. Blood cultures were negative. Urine culture negative. Chest x-ray no acute findings. Initial white blood count on admission 11.7 thousand. Lyme antibodies negative. Ehrlichia antibodies negative. CMV antibodies negative. Patient had positive IgG for Epstein-Barr virus but negative for IgM. [place] spotted fever IgM negative with positive IgG. HIV screen negative.  Patient was afebrile by 17th of July and has had no fever whatsoever since then. He has good appetite. He lost a few pounds during his acute illness but feels his appetite is recovering. He's had no abdominal pain whatsoever. No skin rash. There was consideration of abdominal CT and pelvis if cultures were negative but patient had no persistent symptoms whatsoever  Past Medical History:  Diagnosis Date  . COLONIC POLYPS 07/25/2009  . ELEVATED PROSTATE SPECIFIC ANTIGEN 07/27/2009  . Hearing loss of both ears   . HYPERTROPHY PROSTATE W/UR OBST & OTH LUTS 07/25/2009  . Irregular heart beat   . LICHEN SIMPLEX CHRONICUS 02/03/2009  . Other primary cardiomyopathies 08/08/2010   Past Surgical History:  Procedure Laterality Date  . PROSTATE SURGERY      reports that he quit smoking about 32 years ago. His smoking use included Cigarettes. He has a 20.00 pack-year smoking history. He has never used smokeless  tobacco. He reports that he does not drink alcohol or use drugs. family history includes Anuerysm (age of onset: 57) in his father; Cancer in his mother. No Known Allergies   Review of Systems  Constitutional: Negative for appetite change, chills, fatigue and fever.  HENT: Negative for sore throat.   Respiratory: Negative for cough and shortness of breath.   Cardiovascular: Negative for chest pain.  Gastrointestinal: Negative for abdominal pain.  Genitourinary: Negative for dysuria.  Musculoskeletal: Negative for arthralgias.  Skin: Negative for rash.  Neurological: Negative for weakness and headaches.  Hematological: Negative for adenopathy.       Objective:   Physical Exam  Constitutional: He appears well-developed and well-nourished.  HENT:  Head: Normocephalic.  Right Ear: External ear normal.  Left Ear: External ear normal.  Mouth/Throat: Oropharynx is clear and moist.  Neck: Neck supple.  Cardiovascular: Normal rate and regular rhythm.   No murmur heard. Pulmonary/Chest: Effort normal and breath sounds normal. No respiratory distress. He has no wheezes. He has no rales.  Abdominal: Soft. Bowel sounds are normal. He exhibits no distension and no mass. There is no tenderness. There is no rebound and no guarding.  Lymphadenopathy:    He has no cervical adenopathy.  Skin: No rash noted.       Assessment:     Recent febrile illness. Question viral. Workup was basically unremarkable with exception of positive IgG for [facility] spotted fever. He did give history of recent tick bite but onset of fever was over 6 weeks following his tick bite so doubt this was  clinically significant. In any event, he was treated with doxycycline as above. Doubt malignancy or abscess as he has had no recurrent symptoms whatsoever    Plan:     -Observe for now. -Follow-up immediately for any fever, chills, or other new symptoms. -Consider CT abdomen and pelvis for any recurrent  fever  Eulas Post MD [facility] at [facility]

## 2016-04-24 NOTE — Patient Instructions (Signed)
Follow up promptly for any fever or other concerns We would look at CT abdomen and pelvis if you have any recurrent fever.

## 2016-04-30 ENCOUNTER — Ambulatory Visit (INDEPENDENT_AMBULATORY_CARE_PROVIDER_SITE_OTHER): Payer: PPO | Admitting: Family Medicine

## 2016-04-30 ENCOUNTER — Encounter: Payer: Self-pay | Admitting: Family Medicine

## 2016-04-30 VITALS — BP 110/70 | HR 60 | Temp 97.9°F | Ht 72.0 in | Wt 187.0 lb

## 2016-04-30 DIAGNOSIS — J029 Acute pharyngitis, unspecified: Secondary | ICD-10-CM

## 2016-04-30 NOTE — Progress Notes (Signed)
Subjective:     Patient ID: Mark Pruitt, male   DOB: [birth year], 75 y.o.   MRN: [id number]  HPI Patient seen with sore throat for approximately one-week Location is left side of throat down below the tonsillar region. Worse with drinking things like ours juice or sodas. No fevers or chills. No reflux symptoms. No hoarseness. No difficulty swallowing. He states he's had similar issues in the past always in the same location. He saw ENT previously but they did not find anything abnormal. No recent appetite or weight changes. No postnasal drip symptoms.  Past Medical History:  Diagnosis Date  . COLONIC POLYPS 07/25/2009  . ELEVATED PROSTATE SPECIFIC ANTIGEN 07/27/2009  . Hearing loss of both ears   . HYPERTROPHY PROSTATE W/UR OBST & OTH LUTS 07/25/2009  . Irregular heart beat   . LICHEN SIMPLEX CHRONICUS 02/03/2009  . Other primary cardiomyopathies 08/08/2010   Past Surgical History:  Procedure Laterality Date  . PROSTATE SURGERY      reports that he quit smoking about 32 years ago. His smoking use included Cigarettes. He has a 20.00 pack-year smoking history. He has never used smokeless tobacco. He reports that he does not drink alcohol or use drugs. family history includes Anuerysm (age of onset: 79) in his father; Cancer in his mother. No Known Allergies   Review of Systems  Constitutional: Negative for appetite change, chills, fever and unexpected weight change.  HENT: Positive for sore throat. Negative for postnasal drip.   Respiratory: Negative for shortness of breath.   Gastrointestinal: Negative for nausea and vomiting.  Hematological: Negative for adenopathy.       Objective:   Physical Exam  Constitutional: He appears well-developed and well-nourished.  HENT:  Right Ear: External ear normal.  Left Ear: External ear normal.  Mouth/Throat: Oropharynx is clear and moist. No oropharyngeal exudate.  Neck: Neck supple. No thyromegaly present.  Cardiovascular: Normal  rate.   Pulmonary/Chest: Effort normal and breath sounds normal. No respiratory distress. He has no wheezes.  Lymphadenopathy:    He has no cervical adenopathy.       Assessment:     Sore throat. Exam completely unremarkable. His symptoms are only left-sided. He does not have any adenopathy or other concerns on exam    Plan:     -Suggest he try saltwater gargles 2-3 times daily -Consider over-the-counter Chloraseptic spray for symptom relief. -Consider referral back to ENT if symptoms not improving over the next 1-2 weeks  Eulas Post MD [facility] at [facility]

## 2016-04-30 NOTE — Patient Instructions (Signed)
-  consider salt water gargles 2-3 times daily -Consider OTC Chloraseptic spray.

## 2016-04-30 NOTE — Progress Notes (Signed)
Pre visit review using our clinic review tool, if applicable. No additional management support is needed unless otherwise documented below in the visit note. 

## 2016-05-21 ENCOUNTER — Encounter: Payer: Self-pay | Admitting: Family Medicine

## 2016-05-21 ENCOUNTER — Ambulatory Visit (INDEPENDENT_AMBULATORY_CARE_PROVIDER_SITE_OTHER): Payer: PPO | Admitting: Family Medicine

## 2016-05-21 VITALS — BP 90/70 | HR 76 | Temp 97.8°F | Ht 72.0 in | Wt 189.4 lb

## 2016-05-21 DIAGNOSIS — J029 Acute pharyngitis, unspecified: Secondary | ICD-10-CM

## 2016-05-21 NOTE — Patient Instructions (Signed)
We will set up ENT referral given persistence of symptoms.

## 2016-05-21 NOTE — Progress Notes (Signed)
Subjective:     Patient ID: Mark Pruitt, male   DOB: 1941-01-26, 75 y.o.   MRN: [id number]  HPI Patient seen with persistent left-sided sore throat symptoms. He had symptoms now for approximately one month. He was seen here couple weeks ago with unremarkable exam. He's tried saltwater gargles without improvement. His symptoms are consistently worse with swallowing. No appetite or weight changes. Has not noted any lymphadenopathy. No voice changes. No dysphagia. He had similar type occurrence couple years ago which eventually resolved.  Past Medical History:  Diagnosis Date  . COLONIC POLYPS 07/25/2009  . ELEVATED PROSTATE SPECIFIC ANTIGEN 07/27/2009  . Hearing loss of both ears   . HYPERTROPHY PROSTATE W/UR OBST & OTH LUTS 07/25/2009  . Irregular heart beat   . LICHEN SIMPLEX CHRONICUS 02/03/2009  . Other primary cardiomyopathies 08/08/2010   Past Surgical History:  Procedure Laterality Date  . PROSTATE SURGERY      reports that he quit smoking about 32 years ago. His smoking use included Cigarettes. He has a 20.00 pack-year smoking history. He has never used smokeless tobacco. He reports that he does not drink alcohol or use drugs. family history includes Anuerysm (age of onset: 36) in his father; Cancer in his mother. No Known Allergies   Review of Systems  Constitutional: Negative for appetite change, chills, fever and unexpected weight change.  HENT: Positive for sore throat.   Respiratory: Negative for cough and shortness of breath.   Cardiovascular: Negative for chest pain.  Hematological: Negative for adenopathy.       Objective:   Physical Exam  Constitutional: He appears well-developed and well-nourished.  HENT:  Mouth/Throat: Oropharynx is clear and moist. No oropharyngeal exudate.  Neck: Neck supple.  Cardiovascular: Normal rate and regular rhythm.   Pulmonary/Chest: Effort normal and breath sounds normal. No respiratory distress. He has no wheezes. He has no  rales.  Lymphadenopathy:    He has no cervical adenopathy.       Assessment:     Persistent unilateral (left -sided) sore throat with unremarkable exam    Plan:     -Given duration of symptoms set up ENT referral for further evaluation  Eulas Post MD [facility] at [facility]

## 2016-05-21 NOTE — Progress Notes (Signed)
Pre visit review using our clinic review tool, if applicable. No additional management support is needed unless otherwise documented below in the visit note. 

## 2016-05-29 ENCOUNTER — Other Ambulatory Visit: Payer: Self-pay | Admitting: Otolaryngology

## 2016-05-29 DIAGNOSIS — J029 Acute pharyngitis, unspecified: Secondary | ICD-10-CM

## 2016-06-08 ENCOUNTER — Ambulatory Visit
Admission: RE | Admit: 2016-06-08 | Discharge: 2016-06-08 | Disposition: A | Discharge status: Home / Self Care | Payer: PPO | Source: Ambulatory Visit | Attending: Otolaryngology | Admitting: Otolaryngology

## 2016-06-08 DIAGNOSIS — J029 Acute pharyngitis, unspecified: Secondary | ICD-10-CM

## 2016-06-08 MED ORDER — IOPAMIDOL (ISOVUE-300) INJECTION 61%
75.0000 mL | Freq: Once | INTRAVENOUS | Status: AC | PRN
  Administered 2016-06-08: 75 mL via INTRAVENOUS

## 2016-06-13 ENCOUNTER — Other Ambulatory Visit: Payer: PPO

## 2016-06-14 ENCOUNTER — Other Ambulatory Visit: Payer: Self-pay | Admitting: Otolaryngology

## 2016-11-20 DIAGNOSIS — R69 Illness, unspecified: Secondary | ICD-10-CM | POA: Diagnosis not present

## 2016-12-19 ENCOUNTER — Other Ambulatory Visit: Payer: Self-pay | Admitting: *Deleted

## 2016-12-19 MED ORDER — LISINOPRIL 5 MG PO TABS: 5.0000 mg | ORAL_TABLET | Freq: Every day | ORAL | 1 refills | Status: DC

## 2017-01-15 DIAGNOSIS — Z974 Presence of external hearing-aid: Secondary | ICD-10-CM | POA: Diagnosis not present

## 2017-01-15 DIAGNOSIS — H9113 Presbycusis, bilateral: Secondary | ICD-10-CM | POA: Diagnosis not present

## 2017-01-15 DIAGNOSIS — Z0000 Encounter for general adult medical examination without abnormal findings: Secondary | ICD-10-CM | POA: Diagnosis not present

## 2017-01-15 DIAGNOSIS — H9313 Tinnitus, bilateral: Secondary | ICD-10-CM | POA: Diagnosis not present

## 2017-01-15 DIAGNOSIS — Z79899 Other long term (current) drug therapy: Secondary | ICD-10-CM | POA: Diagnosis not present

## 2017-01-15 DIAGNOSIS — I499 Cardiac arrhythmia, unspecified: Secondary | ICD-10-CM | POA: Diagnosis not present

## 2017-03-01 DIAGNOSIS — H52223 Regular astigmatism, bilateral: Secondary | ICD-10-CM | POA: Diagnosis not present

## 2017-03-01 DIAGNOSIS — H524 Presbyopia: Secondary | ICD-10-CM | POA: Diagnosis not present

## 2017-03-01 DIAGNOSIS — H10413 Chronic giant papillary conjunctivitis, bilateral: Secondary | ICD-10-CM | POA: Diagnosis not present

## 2017-03-01 DIAGNOSIS — H43813 Vitreous degeneration, bilateral: Secondary | ICD-10-CM | POA: Diagnosis not present

## 2017-03-01 DIAGNOSIS — H25813 Combined forms of age-related cataract, bilateral: Secondary | ICD-10-CM | POA: Diagnosis not present

## 2017-03-27 ENCOUNTER — Ambulatory Visit (INDEPENDENT_AMBULATORY_CARE_PROVIDER_SITE_OTHER): Payer: Medicare HMO | Admitting: Family Medicine

## 2017-03-27 ENCOUNTER — Encounter: Payer: Self-pay | Admitting: Family Medicine

## 2017-03-27 VITALS — BP 100/70 | HR 67 | Temp 98.1°F | Ht 71.5 in | Wt 192.2 lb

## 2017-03-27 DIAGNOSIS — Z0000 Encounter for general adult medical examination without abnormal findings: Secondary | ICD-10-CM

## 2017-03-27 LAB — HEPATIC FUNCTION PANEL
ALT: 12 U/L (ref 0–53)
AST: 13 U/L (ref 0–37)
Albumin: 4.1 g/dL (ref 3.5–5.2)
Alkaline Phosphatase: 50 U/L (ref 39–117)
Bilirubin, Direct: 0.1 mg/dL (ref 0.0–0.3)
Total Bilirubin: 0.6 mg/dL (ref 0.2–1.2)
Total Protein: 6.6 g/dL (ref 6.0–8.3)

## 2017-03-27 LAB — LIPID PANEL
Cholesterol: 177 mg/dL (ref 0–200)
HDL: 54.6 mg/dL (ref >39.00)
LDL Cholesterol: 102 mg/dL — ABNORMAL HIGH (ref 0–99)
NonHDL: 122.87
Total CHOL/HDL Ratio: 3
Triglycerides: 106 mg/dL (ref 0.0–149.0)
VLDL: 21.2 mg/dL (ref 0.0–40.0)

## 2017-03-27 LAB — CBC WITH DIFFERENTIAL/PLATELET
Basophils Absolute: 0 K/uL (ref 0.0–0.1)
Basophils Relative: 0.4 % (ref 0.0–3.0)
Eosinophils Absolute: 0.1 K/uL (ref 0.0–0.7)
Eosinophils Relative: 2.2 % (ref 0.0–5.0)
HCT: 45.2 % (ref 39.0–52.0)
Hemoglobin: 15.1 g/dL (ref 13.0–17.0)
Lymphocytes Relative: 29.3 % (ref 12.0–46.0)
Lymphs Abs: 2 K/uL (ref 0.7–4.0)
MCHC: 33.4 g/dL (ref 30.0–36.0)
MCV: 90.8 fl (ref 78.0–100.0)
Monocytes Absolute: 0.7 K/uL (ref 0.1–1.0)
Monocytes Relative: 11.1 % (ref 3.0–12.0)
Neutro Abs: 3.8 K/uL (ref 1.4–7.7)
Neutrophils Relative %: 57 % (ref 43.0–77.0)
Platelets: 162 K/uL (ref 150.0–400.0)
RBC: 4.98 Mil/uL (ref 4.22–5.81)
RDW: 14.2 % (ref 11.5–15.5)
WBC: 6.7 K/uL (ref 4.0–10.5)

## 2017-03-27 LAB — BASIC METABOLIC PANEL WITH GFR
BUN: 17 mg/dL (ref 6–23)
CO2: 30 meq/L (ref 19–32)
Calcium: 9.5 mg/dL (ref 8.4–10.5)
Chloride: 105 meq/L (ref 96–112)
Creatinine, Ser: 0.97 mg/dL (ref 0.40–1.50)
GFR: 79.64 mL/min (ref >60.00)
Glucose, Bld: 99 mg/dL (ref 70–99)
Potassium: 4.1 meq/L (ref 3.5–5.1)
Sodium: 139 meq/L (ref 135–145)

## 2017-03-27 LAB — TSH: TSH: 3.02 u[IU]/mL (ref 0.35–4.50)

## 2017-03-27 MED ORDER — LISINOPRIL 5 MG PO TABS: 5.0000 mg | ORAL_TABLET | Freq: Every day | ORAL | 3 refills | Status: DC

## 2017-03-27 NOTE — Patient Instructions (Addendum)
Consider new shingles vaccine- check on insurance coverage.  Consider Medicare Annual Wellness Visit.

## 2017-03-27 NOTE — Progress Notes (Signed)
Subjective:     Patient ID: Mark Pruitt, male   DOB: [birth year], 76 y.o.   MRN: [id number]  HPI Patient seen for physical exam. He has history of hypertension which has been well controlled with low-dose lisinopril. He has history of adenomatous colon polyps and remote history of prostate cancer. Still followed by urologist yearly for PSA testing. Quit smoking over 30 years ago.  Immunizations up-to-date. Only exception is he has not had new shingles vaccine. He plans to check on insurance coverage. Walks for exercise.  Past Medical History:  Diagnosis Date  . COLONIC POLYPS 07/25/2009  . ELEVATED PROSTATE SPECIFIC ANTIGEN 07/27/2009  . Hearing loss of both ears   . HYPERTROPHY PROSTATE W/UR OBST & OTH LUTS 07/25/2009  . Irregular heart beat   . LICHEN SIMPLEX CHRONICUS 02/03/2009  . Other primary cardiomyopathies 08/08/2010   Past Surgical History:  Procedure Laterality Date  . PROSTATE SURGERY      reports that he quit smoking about 33 years ago. His smoking use included Cigarettes. He has a 20.00 pack-year smoking history. He has never used smokeless tobacco. He reports that he does not drink alcohol or use drugs. family history includes Anuerysm (age of onset: 53) in his father; Cancer in his mother. No Known Allergies   Review of Systems  Constitutional: Negative for activity change, appetite change, fatigue and fever.  HENT: Negative for congestion, ear pain and trouble swallowing.   Eyes: Negative for pain and visual disturbance.  Respiratory: Negative for cough, shortness of breath and wheezing.   Cardiovascular: Negative for chest pain and palpitations.  Gastrointestinal: Negative for abdominal distention, abdominal pain, blood in stool, constipation, diarrhea, nausea, rectal pain and vomiting.  Endocrine: Negative for polydipsia and polyuria.  Genitourinary: Negative for dysuria, hematuria and testicular pain.  Musculoskeletal: Negative for arthralgias and joint  swelling.  Skin: Negative for rash.  Neurological: Negative for dizziness, syncope and headaches.  Hematological: Negative for adenopathy.  Psychiatric/Behavioral: Negative for confusion and dysphoric mood.       Objective:   Physical Exam  Constitutional: He is oriented to person, place, and time. He appears well-developed and well-nourished. No distress.  HENT:  Head: Normocephalic and atraumatic.  Right Ear: External ear normal.  Left Ear: External ear normal.  Mouth/Throat: Oropharynx is clear and moist.  Eyes: Conjunctivae and EOM are normal. Pupils are equal, round, and reactive to light.  Neck: Normal range of motion. Neck supple. No thyromegaly present.  Cardiovascular: Normal rate, regular rhythm and normal heart sounds.   No murmur heard. Pulmonary/Chest: No respiratory distress. He has no wheezes. He has no rales.  Abdominal: Soft. Bowel sounds are normal. He exhibits no distension and no mass. There is no tenderness. There is no rebound and no guarding.  Musculoskeletal: He exhibits no edema.  Lymphadenopathy:    He has no cervical adenopathy.  Neurological: He is alert and oriented to person, place, and time. He displays normal reflexes. No cranial nerve deficit.  Skin: No rash noted.  Psychiatric: He has a normal mood and affect.       Assessment:     Complete physical. Patient is generally very healthy. He has remote history of prostate cancer as above and hypertension well controlled with lisinopril    Plan:     -Refill lisinopril for one year -Obtain screening lab work -He plans to continue yearly follow-up with urology regarding his prostate cancer -Discussed new shingles vaccine and he will check on coverage -Consider setting up  Medicare annual wellness visit here with our health coach -Continue regular exercise with walking -We'll need repeat colonoscopy later this year  Eulas Post MD [facility] at [facility]

## 2017-05-29 DIAGNOSIS — R69 Illness, unspecified: Secondary | ICD-10-CM | POA: Diagnosis not present

## 2017-06-06 DIAGNOSIS — H25813 Combined forms of age-related cataract, bilateral: Secondary | ICD-10-CM | POA: Diagnosis not present

## 2017-06-06 DIAGNOSIS — H524 Presbyopia: Secondary | ICD-10-CM | POA: Diagnosis not present

## 2017-06-20 ENCOUNTER — Encounter: Payer: Self-pay | Admitting: Family Medicine

## 2017-07-01 DIAGNOSIS — H2512 Age-related nuclear cataract, left eye: Secondary | ICD-10-CM | POA: Diagnosis not present

## 2017-07-22 DIAGNOSIS — H2512 Age-related nuclear cataract, left eye: Secondary | ICD-10-CM | POA: Diagnosis not present

## 2017-07-22 DIAGNOSIS — H25812 Combined forms of age-related cataract, left eye: Secondary | ICD-10-CM | POA: Diagnosis not present

## 2017-07-26 DIAGNOSIS — C61 Malignant neoplasm of prostate: Secondary | ICD-10-CM | POA: Diagnosis not present

## 2017-07-30 ENCOUNTER — Ambulatory Visit (INDEPENDENT_AMBULATORY_CARE_PROVIDER_SITE_OTHER): Payer: Medicare HMO | Admitting: *Deleted

## 2017-07-30 DIAGNOSIS — Z23 Encounter for immunization: Secondary | ICD-10-CM

## 2017-07-30 DIAGNOSIS — H2511 Age-related nuclear cataract, right eye: Secondary | ICD-10-CM | POA: Diagnosis not present

## 2017-08-02 DIAGNOSIS — C61 Malignant neoplasm of prostate: Secondary | ICD-10-CM | POA: Diagnosis not present

## 2017-08-02 DIAGNOSIS — N5201 Erectile dysfunction due to arterial insufficiency: Secondary | ICD-10-CM | POA: Diagnosis not present

## 2017-08-06 ENCOUNTER — Encounter: Payer: Self-pay | Admitting: Gastroenterology

## 2017-08-13 DIAGNOSIS — J029 Acute pharyngitis, unspecified: Secondary | ICD-10-CM | POA: Diagnosis not present

## 2017-08-26 DIAGNOSIS — H25811 Combined forms of age-related cataract, right eye: Secondary | ICD-10-CM | POA: Diagnosis not present

## 2017-08-26 DIAGNOSIS — H2511 Age-related nuclear cataract, right eye: Secondary | ICD-10-CM | POA: Diagnosis not present

## 2017-10-24 DIAGNOSIS — H2512 Age-related nuclear cataract, left eye: Secondary | ICD-10-CM | POA: Diagnosis not present

## 2017-10-24 DIAGNOSIS — H2511 Age-related nuclear cataract, right eye: Secondary | ICD-10-CM | POA: Diagnosis not present

## 2017-10-24 DIAGNOSIS — Z961 Presence of intraocular lens: Secondary | ICD-10-CM | POA: Diagnosis not present

## 2017-12-12 DIAGNOSIS — R69 Illness, unspecified: Secondary | ICD-10-CM | POA: Diagnosis not present

## 2018-01-15 ENCOUNTER — Ambulatory Visit (INDEPENDENT_AMBULATORY_CARE_PROVIDER_SITE_OTHER): Payer: Medicare HMO | Admitting: Family Medicine

## 2018-01-15 ENCOUNTER — Encounter: Payer: Self-pay | Admitting: Family Medicine

## 2018-01-15 VITALS — BP 98/50 | HR 54 | Temp 97.5°F | Wt 192.9 lb

## 2018-01-15 DIAGNOSIS — D126 Benign neoplasm of colon, unspecified: Secondary | ICD-10-CM | POA: Diagnosis not present

## 2018-01-15 DIAGNOSIS — L821 Other seborrheic keratosis: Secondary | ICD-10-CM | POA: Insufficient documentation

## 2018-01-15 DIAGNOSIS — Z8679 Personal history of other diseases of the circulatory system: Secondary | ICD-10-CM

## 2018-01-15 DIAGNOSIS — D1800 Hemangioma unspecified site: Secondary | ICD-10-CM | POA: Insufficient documentation

## 2018-01-15 DIAGNOSIS — D1801 Hemangioma of skin and subcutaneous tissue: Secondary | ICD-10-CM

## 2018-01-15 DIAGNOSIS — R42 Dizziness and giddiness: Secondary | ICD-10-CM | POA: Diagnosis not present

## 2018-01-15 NOTE — Patient Instructions (Signed)
You have two benign skin lesion on scalp- seborrheic keratosis and the other is an angioma (reddish color).  We will set up repeat colonoscopy.    I would like for you you to schedule a Medicare Annual Wellness Visit (AWV).   This is a yearly appointment with our Health Coach Wynetta Fines, RN) and is designed to develop a personalized prevention plan. This is not a head to toe physical, but rather an opportunity to prevent illness based on your current health and risk factors for disease.   Visits usually last 30-60 minutes and include various screenings for hearing, vision, depression, and dementia, falls, and safety concerns. The visit also includes diet and exercise counseling and information about advance directives.   This is also an opportunity to discuss appropriate health maintenance testing such as mammography, colonoscopy, lung cancer screening, and hepatitis C testing.   The AWV is fully covered by Medicare Part B if:  . You have had Part B for over 12 months, AND . You have not had an AWV in the past 12 months .  Please don't miss out on this opportunity! Set up your appointment today!

## 2018-01-15 NOTE — Progress Notes (Signed)
Subjective:     Patient ID: Mark Pruitt, male   DOB: 09/01/1941, 77 y.o.   MRN: [id number]  HPI Patient here today to discuss several issues as follows:  He had colonoscopy December 2015 with for adenomas. He apparently was contacted in November for follow-up but never went. He is requesting referral. No recent change of bowel habits. No bloody stools.  He has a couple skin lesions on his left forehead that he wishes to have evaluated. Just recently noticed these. Asymptomatic. No history of skin cancer.  He's had some sore throat over the past year intermittently. Went to ENT and no etiology found. Symptoms are very intermittent. No associated adenopathy. No appetite or weight changes. No swallowing difficulties. No postnasal drip  Has occasional lightheadedness with standing. Symptoms very inconsistent. He had cardiomyopathy by echocardiogram 2011 with ejection depression 45-50%. Subsequent echocardiogram year later 50-55%. Has been on lisinopril since then. No chest pains. No dyspnea.  Past Medical History:  Diagnosis Date  . COLONIC POLYPS 07/25/2009  . ELEVATED PROSTATE SPECIFIC ANTIGEN 07/27/2009  . Hearing loss of both ears   . HYPERTROPHY PROSTATE W/UR OBST & OTH LUTS 07/25/2009  . Irregular heart beat   . LICHEN SIMPLEX CHRONICUS 02/03/2009  . Other primary cardiomyopathies 08/08/2010   Past Surgical History:  Procedure Laterality Date  . PROSTATE SURGERY      reports that he quit smoking about 34 years ago. His smoking use included cigarettes. He has a 20.00 pack-year smoking history. He has never used smokeless tobacco. He reports that he does not drink alcohol or use drugs. family history includes Anuerysm (age of onset: 69) in his father; Cancer in his mother. Not on File   Review of Systems  Constitutional: Negative for fatigue and unexpected weight change.  Eyes: Negative for visual disturbance.  Respiratory: Negative for cough, chest tightness and shortness  of breath.   Cardiovascular: Negative for chest pain, palpitations and leg swelling.  Gastrointestinal: Negative for abdominal pain and blood in stool.  Neurological: Negative for dizziness, syncope, weakness, light-headedness and headaches.       Objective:   Physical Exam  Constitutional: He is oriented to person, place, and time. He appears well-developed and well-nourished.  HENT:  Right Ear: External ear normal.  Left Ear: External ear normal.  Mouth/Throat: Oropharynx is clear and moist.  Eyes: Pupils are equal, round, and reactive to light.  Neck: Neck supple. No thyromegaly present.  Cardiovascular: Normal rate and regular rhythm.  Pulmonary/Chest: Effort normal and breath sounds normal. No respiratory distress. He has no wheezes. He has no rales.  Musculoskeletal: He exhibits no edema.  Neurological: He is alert and oriented to person, place, and time.  Skin:  Patient small erythematous angioma left forehead. He also has a separate lesion which is about 2 mm diameter which is minimally raised brown well-demarcated seborrheic keratosis       Assessment:     #1 history of colon adenomas due for follow-up colonoscopy as last was over 3 years ago  #2 benign-appearing skin lesions left forehead including seborrheic keratosis and angioma  #3 occasional lightheadedness. He was placed on low-dose ACE inhibitor secondary to mild cardiomyopathy several years ago. Follow-up echo showed recovery. On exam today standing blood pressure 105/64 and asymptomatic    Plan:     -Referral back to GI for repeat colonoscopy -Reassurance regarding benign appearing skin lesions of left forehead -Encourage better hydration -We discussed option of discontinuing low-dose lisinopril but he has generally  done fairly well this for several years and since his symptoms are very inconsistent and infrequent we recommended continue with this for now  Eulas Post MD [facility] at  [facility]

## 2018-03-03 ENCOUNTER — Ambulatory Visit: Payer: Medicare HMO | Admitting: Gastroenterology

## 2018-03-11 ENCOUNTER — Ambulatory Visit: Payer: Medicare HMO | Admitting: Gastroenterology

## 2018-03-11 ENCOUNTER — Encounter: Payer: Self-pay | Admitting: Gastroenterology

## 2018-03-11 VITALS — BP 112/52 | HR 64 | Ht 72.0 in | Wt 193.5 lb

## 2018-03-11 DIAGNOSIS — Z860100 Personal history of colon polyps, unspecified: Secondary | ICD-10-CM

## 2018-03-11 MED ORDER — PEG 3350-KCL-NA BICARB-NACL 420 G PO SOLR: 4000.0000 mL | ORAL | 0 refills | Status: DC

## 2018-03-11 NOTE — Progress Notes (Signed)
Review of pertinent gastrointestinal problems: 1. Adenomatous colon polyps removed remotely; colonoscopy 2010 [facility] without adenomatous polyps. Colonoscopy Dr. Ardis Hughs 08/2014 found diverticulosis and 4 subCM polyps, all were adenomas, recommended recall at 3 years.     HPI: This is a very pleasant 77 year old man whom I last saw at the time of a surveillance colonoscopy about 3-1/2 years ago.     Chief complaint is personal history of precancerous colon polyps  He has had no issues with his bowels since his last colonoscopy 3-1/2 years ago.  No overt bleeding.  No GI distress, no changes in his bowels.  No gi issues.  No blood thinners  No colon cancer.  Personal history of precancerous colon polyps   Old Data Reviewed:     Review of systems: Pertinent positive and negative review of systems were noted in the above HPI section. All other review negative.   Past Medical History:  Diagnosis Date  . COLONIC POLYPS 07/25/2009  . ELEVATED PROSTATE SPECIFIC ANTIGEN 07/27/2009  . Hearing loss of both ears   . HYPERTROPHY PROSTATE W/UR OBST & OTH LUTS 07/25/2009  . Irregular heart beat   . LICHEN SIMPLEX CHRONICUS 02/03/2009  . Other primary cardiomyopathies 08/08/2010    Past Surgical History:  Procedure Laterality Date  . PROSTATE SURGERY      Current Outpatient Medications  Medication Sig Dispense Refill  . aspirin EC 81 MG tablet Take 81 mg by mouth daily.    Marland Kitchen Bioflavonoid Products (ESTER-C) TABS Take 1 tablet by mouth daily.    Marland Kitchen lisinopril (PRINIVIL,ZESTRIL) 5 MG tablet Take 1 tablet (5 mg total) by mouth daily. 90 tablet 3  . Multiple Vitamin (MULTIVITAMIN WITH MINERALS) TABS tablet Take 1 tablet by mouth daily.    . Omega-3 Fatty Acids (FISH OIL) 1000 MG CAPS Take by mouth. Take one tablet once per day.    . triamcinolone cream (KENALOG) 0.1 % Apply 1 application topically 2 (two) times daily as needed (for itching/irritation).     No current facility-administered  medications for this visit.     Allergies as of 03/11/2018  . (No Known Allergies)    Family History  Problem Relation Age of Onset  . Cancer Mother        parathyroid adenoma  . Anuerysm Father 51       abdominal  . Arthritis Neg Hx        family hx  . Colon cancer Neg Hx     Social History   Socioeconomic History  . Marital status: Married    Spouse name: Not on file  . Number of children: Not on file  . Years of education: Not on file  . Highest education level: Not on file  Occupational History  . Not on file  Social Needs  . Financial resource strain: Not on file  . Food insecurity:    Worry: Not on file    Inability: Not on file  . Transportation needs:    Medical: Not on file    Non-medical: Not on file  Tobacco Use  . Smoking status: Former Smoker    Packs/day: 1.00    Years: 20.00    Pack years: 20.00    Types: Cigarettes    Last attempt to quit: 10/22/1983    Years since quitting: 34.4  . Smokeless tobacco: Never Used  Substance and Sexual Activity  . Alcohol use: No    Alcohol/week: 0.0 oz  . Drug use: No  . Sexual activity:  Not on file  Lifestyle  . Physical activity:    Days per week: Not on file    Minutes per session: Not on file  . Stress: Not on file  Relationships  . Social connections:    Talks on phone: Not on file    Gets together: Not on file    Attends religious service: Not on file    Active member of club or organization: Not on file    Attends meetings of clubs or organizations: Not on file    Relationship status: Not on file  . Intimate partner violence:    Fear of current or ex partner: Not on file    Emotionally abused: Not on file    Physically abused: Not on file    Forced sexual activity: Not on file  Other Topics Concern  . Not on file  Social History Narrative   From [place] originally      Physical Exam: BP (!) 112/52   Pulse 64   Ht 6' (1.829 m)   Wt 193 lb 8 oz (87.8 kg)   BMI 26.24 kg/m   Constitutional: generally well-appearing Psychiatric: alert and oriented x3 Eyes: extraocular movements intact Mouth: oral pharynx moist, no lesions Neck: supple no lymphadenopathy Cardiovascular: heart regular rate and rhythm Lungs: clear to auscultation bilaterally Abdomen: soft, nontender, nondistended, no obvious ascites, no peritoneal signs, normal bowel sounds Extremities: no lower extremity edema bilaterally Skin: no lesions on visible extremities   Assessment and plan: 77 y.o. male with  personal history of precancerous colon polyps  He is still in very good shape overall, I think that colon polyp surveillance is still he reasonable clinical endeavor for him and I recommended colonoscopy to be scheduled at his soonest convenience.  I see no reason for any further blood tests or imaging studies prior to then.   Please see the "Patient Instructions" section for addition details about the plan.   Owens Loffler, MD [facility] 03/11/2018, 1:39 PM  Cc: Eulas Post, MD

## 2018-03-11 NOTE — Patient Instructions (Addendum)
You will be set up for a colonoscopy for polyp surveillance.  Normal BMI (Body Mass Index- based on height and weight) is between 23 and 30. Your BMI today is Body mass index is 26.24 kg/m. Mark Pruitt Please consider follow up  regarding your BMI with your Primary Care Provider.

## 2018-03-31 ENCOUNTER — Ambulatory Visit (INDEPENDENT_AMBULATORY_CARE_PROVIDER_SITE_OTHER): Payer: Medicare HMO | Admitting: Family Medicine

## 2018-03-31 ENCOUNTER — Encounter: Payer: Self-pay | Admitting: Family Medicine

## 2018-03-31 VITALS — BP 120/78 | HR 78 | Temp 97.3°F | Ht 71.5 in | Wt 188.5 lb

## 2018-03-31 DIAGNOSIS — Z8679 Personal history of other diseases of the circulatory system: Secondary | ICD-10-CM | POA: Diagnosis not present

## 2018-03-31 DIAGNOSIS — Z0000 Encounter for general adult medical examination without abnormal findings: Secondary | ICD-10-CM | POA: Diagnosis not present

## 2018-03-31 LAB — LIPID PANEL
Cholesterol: 169 mg/dL (ref 0–200)
HDL: 58.7 mg/dL (ref >39.00)
LDL Cholesterol: 96 mg/dL (ref 0–99)
NonHDL: 110.44
Total CHOL/HDL Ratio: 3
Triglycerides: 72 mg/dL (ref 0.0–149.0)
VLDL: 14.4 mg/dL (ref 0.0–40.0)

## 2018-03-31 LAB — HEPATIC FUNCTION PANEL
ALT: 18 U/L (ref 0–53)
AST: 20 U/L (ref 0–37)
Albumin: 4.3 g/dL (ref 3.5–5.2)
Alkaline Phosphatase: 51 U/L (ref 39–117)
Bilirubin, Direct: 0.2 mg/dL (ref 0.0–0.3)
Total Bilirubin: 1 mg/dL (ref 0.2–1.2)
Total Protein: 7 g/dL (ref 6.0–8.3)

## 2018-03-31 LAB — BASIC METABOLIC PANEL WITH GFR
BUN: 23 mg/dL (ref 6–23)
CO2: 29 meq/L (ref 19–32)
Calcium: 9.9 mg/dL (ref 8.4–10.5)
Chloride: 108 meq/L (ref 96–112)
Creatinine, Ser: 0.94 mg/dL (ref 0.40–1.50)
GFR: 82.36 mL/min (ref >60.00)
Glucose, Bld: 114 mg/dL — ABNORMAL HIGH (ref 70–99)
Potassium: 5.1 meq/L (ref 3.5–5.1)
Sodium: 143 meq/L (ref 135–145)

## 2018-03-31 LAB — CBC WITH DIFFERENTIAL/PLATELET
Basophils Absolute: 0 K/uL (ref 0.0–0.1)
Basophils Relative: 0.7 % (ref 0.0–3.0)
Eosinophils Absolute: 0.1 K/uL (ref 0.0–0.7)
Eosinophils Relative: 1.4 % (ref 0.0–5.0)
HCT: 45.9 % (ref 39.0–52.0)
Hemoglobin: 15.4 g/dL (ref 13.0–17.0)
Lymphocytes Relative: 21.9 % (ref 12.0–46.0)
Lymphs Abs: 1.5 K/uL (ref 0.7–4.0)
MCHC: 33.5 g/dL (ref 30.0–36.0)
MCV: 90.8 fl (ref 78.0–100.0)
Monocytes Absolute: 0.7 K/uL (ref 0.1–1.0)
Monocytes Relative: 10.8 % (ref 3.0–12.0)
Neutro Abs: 4.4 K/uL (ref 1.4–7.7)
Neutrophils Relative %: 65.2 % (ref 43.0–77.0)
Platelets: 175 K/uL (ref 150.0–400.0)
RBC: 5.05 Mil/uL (ref 4.22–5.81)
RDW: 14.8 % (ref 11.5–15.5)
WBC: 6.8 K/uL (ref 4.0–10.5)

## 2018-03-31 LAB — TSH: TSH: 1.32 u[IU]/mL (ref 0.35–4.50)

## 2018-03-31 MED ORDER — LISINOPRIL 5 MG PO TABS: 5.0000 mg | ORAL_TABLET | Freq: Every day | ORAL | 3 refills | Status: DC

## 2018-03-31 NOTE — Patient Instructions (Signed)
Consider new shingles vaccine (Shingrix) and check with pharmacy if interested.

## 2018-03-31 NOTE — Progress Notes (Signed)
Subjective:     Patient ID: Mark Pruitt, male   DOB: [birth year], 77 y.o.   MRN: [id number]  HPI Patient seen for physical exam. He has history of mild nonischemic cardiomyopathy and is maintained on low-dose lisinopril. He's had history of very mild hyperlipidemia in the past year.   history of prostate cancer-followed by urology and gets yearly PSA through them  History of benign colon polyps. He is scheduled for repeat colonoscopy next month. Last tetanus 10 years ago. He's had both pneumonia vaccines. He had previous Zostavax vaccine 2009.  Walks some for exercise and also plays golf usually couple times per week  Past Medical History:  Diagnosis Date  . COLONIC POLYPS 07/25/2009  . ELEVATED PROSTATE SPECIFIC ANTIGEN 07/27/2009  . Hearing loss of both ears   . HYPERTROPHY PROSTATE W/UR OBST & OTH LUTS 07/25/2009  . Irregular heart beat   . LICHEN SIMPLEX CHRONICUS 02/03/2009  . Other primary cardiomyopathies 08/08/2010   Past Surgical History:  Procedure Laterality Date  . PROSTATE SURGERY      reports that he quit smoking about 34 years ago. His smoking use included cigarettes. He has a 20.00 pack-year smoking history. He has never used smokeless tobacco. He reports that he does not drink alcohol or use drugs. family history includes Anuerysm (age of onset: 23) in his father; Cancer in his mother. No Known Allergies   Review of Systems  Constitutional: Negative for activity change, appetite change, fatigue, fever and unexpected weight change.  HENT: Negative for congestion, ear pain and trouble swallowing.   Eyes: Negative for pain and visual disturbance.  Respiratory: Negative for cough, shortness of breath and wheezing.   Cardiovascular: Negative for chest pain and palpitations.  Gastrointestinal: Negative for abdominal distention, abdominal pain, blood in stool, constipation, diarrhea, nausea, rectal pain and vomiting.  Genitourinary: Negative for dysuria, hematuria  and testicular pain.  Musculoskeletal: Positive for arthralgias and back pain. Negative for joint swelling.  Skin: Negative for rash.  Neurological: Negative for dizziness, syncope and headaches.  Hematological: Negative for adenopathy.  Psychiatric/Behavioral: Negative for confusion and dysphoric mood.       Objective:   Physical Exam  Constitutional: He is oriented to person, place, and time. He appears well-developed and well-nourished. No distress.  HENT:  Head: Normocephalic and atraumatic.  Right Ear: External ear normal.  Left Ear: External ear normal.  Mouth/Throat: Oropharynx is clear and moist.  Eyes: Pupils are equal, round, and reactive to light. Conjunctivae and EOM are normal.  Neck: Normal range of motion. Neck supple. No thyromegaly present.  Cardiovascular: Normal rate, regular rhythm and normal heart sounds.  No murmur heard. Pulmonary/Chest: No respiratory distress. He has no wheezes. He has no rales.  Abdominal: Soft. Bowel sounds are normal. He exhibits no distension and no mass. There is no tenderness. There is no rebound and no guarding.  Musculoskeletal: He exhibits no edema.  Lymphadenopathy:    He has no cervical adenopathy.  Neurological: He is alert and oriented to person, place, and time. He displays normal reflexes. No cranial nerve deficit.  Skin: No rash noted.  Psychiatric: He has a normal mood and affect.       Assessment:     #1 Physical exam. Several issues discussed as below  #2 History of prostate cancer followed by urology  #3 History of nonischemic cardiomyopathy symptomatically stable and maintained on low-dose lisinopril  #4 History of mild hyperlipidemia    Plan:     -We discussed  shingles vaccine and he will check on insurance coverage for that -Consider tetanus booster some point this year. He will check on insurance coverage -Repeat colonoscopy already scheduled -Continue with yearly flu vaccine -Check further labs with  lipid panel, hepatic panel, CBC, basic metabolic panel  Eulas Post MD [facility] at [facility]

## 2018-04-09 DIAGNOSIS — H10413 Chronic giant papillary conjunctivitis, bilateral: Secondary | ICD-10-CM | POA: Diagnosis not present

## 2018-04-09 DIAGNOSIS — Z961 Presence of intraocular lens: Secondary | ICD-10-CM | POA: Diagnosis not present

## 2018-04-09 DIAGNOSIS — H26493 Other secondary cataract, bilateral: Secondary | ICD-10-CM | POA: Diagnosis not present

## 2018-04-09 DIAGNOSIS — H43813 Vitreous degeneration, bilateral: Secondary | ICD-10-CM | POA: Diagnosis not present

## 2018-05-07 ENCOUNTER — Encounter: Payer: Self-pay | Admitting: Gastroenterology

## 2018-05-07 ENCOUNTER — Ambulatory Visit (AMBULATORY_SURGERY_CENTER): Payer: Medicare HMO | Admitting: Gastroenterology

## 2018-05-07 VITALS — BP 100/61 | HR 68 | Temp 98.9°F | Resp 14 | Ht 72.0 in | Wt 193.0 lb

## 2018-05-07 DIAGNOSIS — Z860100 Personal history of colon polyps, unspecified: Secondary | ICD-10-CM

## 2018-05-07 DIAGNOSIS — K5730 Diverticulosis of large intestine without perforation or abscess without bleeding: Secondary | ICD-10-CM

## 2018-05-07 DIAGNOSIS — D122 Benign neoplasm of ascending colon: Secondary | ICD-10-CM

## 2018-05-07 MED ORDER — SODIUM CHLORIDE 0.9 % IV SOLN: 500.0000 mL | Freq: Once | INTRAVENOUS | Status: DC

## 2018-05-07 NOTE — Progress Notes (Signed)
Called to room to assist during endoscopic procedure.  Patient ID and intended procedure confirmed with present staff. Received instructions for my participation in the procedure from the performing physician.  

## 2018-05-07 NOTE — Patient Instructions (Signed)
Thank you for allowing [place] to care for you today!  Await pathology results by mail, 2-3 weeks.  Handouts given for polyps and diverticulosis.     YOU HAD AN ENDOSCOPIC PROCEDURE TODAY AT [facility]:   Refer to the procedure report that was given to you for any specific questions about what was found during the examination.  If the procedure report does not answer your questions, please call your gastroenterologist to clarify.  If you requested that your care partner not be given the details of your procedure findings, then the procedure report has been included in a sealed envelope for you to review at your convenience later.  YOU SHOULD EXPECT: Some feelings of bloating in the abdomen. Passage of more gas than usual.  Walking can help get rid of the air that was put into your GI tract during the procedure and reduce the bloating. If you had a lower endoscopy (such as a colonoscopy or flexible sigmoidoscopy) you may notice spotting of blood in your stool or on the toilet paper. If you underwent a bowel prep for your procedure, you may not have a normal bowel movement for a few days.  Please Note:  You might notice some irritation and congestion in your nose or some drainage.  This is from the oxygen used during your procedure.  There is no need for concern and it should clear up in a day or so.  SYMPTOMS TO REPORT IMMEDIATELY:   Following lower endoscopy (colonoscopy or flexible sigmoidoscopy):  Excessive amounts of blood in the stool  Significant tenderness or worsening of abdominal pains  Swelling of the abdomen that is new, acute  Fever of 100F or higher    For urgent or emergent issues, a gastroenterologist can be reached at any hour by calling [phone].   DIET:  We do recommend a small meal at first, but then you may proceed to your regular diet.  Drink plenty of fluids but you should avoid alcoholic beverages for 24 hours.  ACTIVITY:  You should plan to  take it easy for the rest of today and you should NOT DRIVE or use heavy machinery until tomorrow (because of the sedation medicines used during the test).    FOLLOW UP: Our staff will call the number listed on your records the next business day following your procedure to check on you and address any questions or concerns that you may have regarding the information given to you following your procedure. If we do not reach you, we will leave a message.  However, if you are feeling well and you are not experiencing any problems, there is no need to return our call.  We will assume that you have returned to your regular daily activities without incident.  If any biopsies were taken you will be contacted by phone or by letter within the next 1-3 weeks.  Please call us at [phone] if you have not heard about the biopsies in 3 weeks.    SIGNATURES/CONFIDENTIALITY: You and/or your care partner have signed paperwork which will be entered into your electronic medical record.  These signatures attest to the fact that that the information above on your After Visit Summary has been reviewed and is understood.  Full responsibility of the confidentiality of this discharge information lies with you and/or your care-partner.

## 2018-05-07 NOTE — Op Note (Signed)
 [facility] Patient Name: Mark Pruitt Procedure Date: 05/07/2018 2:21 PM MRN: [id number] Endoscopist: Rachael Fee , MD Age: 77 Referring MD:  Date of Birth: Sep 12, 1941 Gender: Male Account #: [id number] Procedure:                Colonoscopy Indications:              High risk colon cancer surveillance: Personal                            history of colonic polyps; Adenomatous colon polyps                            removed remotely; colonoscopy 2010 [facility]er[facility] Mcgath without                            adenomatous polyps. Colonoscopy Dr. Christella Hartigan 08/2014                            found diverticulosis and 4 subCM polyps, all were                            adenomas Medicines:                Monitored Anesthesia Care Procedure:                Pre-Anesthesia Assessment:                           - Prior to the procedure, a History and Physical                            was performed, and patient medications and                            allergies were reviewed. The patient's tolerance of                            previous anesthesia was also reviewed. The risks                            and benefits of the procedure and the sedation                            options and risks were discussed with the patient.                            All questions were answered, and informed consent                            was obtained. Prior Anticoagulants: The patient has                            taken no previous anticoagulant or antiplatelet  agents. ASA Grade Assessment: II - A patient with                            mild systemic disease. After reviewing the risks                            and benefits, the patient was deemed in                            satisfactory condition to undergo the procedure.                           After obtaining informed consent, the colonoscope                            was passed under direct vision. Throughout the                            procedure, the patient's blood pressure, pulse, and                            oxygen saturations were monitored continuously. The                            Colonoscope was introduced through the anus and                            advanced to the the cecum, identified by                            appendiceal orifice and ileocecal valve. The                            colonoscopy was performed without difficulty. The                            patient tolerated the procedure well. The quality                            of the bowel preparation was good. The ileocecal                            valve, appendiceal orifice, and rectum were                            photographed. Scope In: 2:22:45 PM Scope Out: 2:34:31 PM Scope Withdrawal Time: 0 hours 8 minutes 33 seconds  Total Procedure Duration: 0 hours 11 minutes 46 seconds  Findings:                 A 4 mm polyp was found in the ascending colon. The                            polyp was sessile. The polyp was removed with a  cold snare. Resection and retrieval were complete.                           Multiple small-mouthed diverticula were found in                            the left colon.                           The exam was otherwise without abnormality on                            direct and retroflexion views. Complications:            No immediate complications. Estimated blood loss:                            None. Estimated Blood Loss:     Estimated blood loss: none. Impression:               - One 4 mm polyp in the ascending colon, removed                            with a cold snare. Resected and retrieved.                           - Diverticulosis in the left colon.                           - The examination was otherwise normal on direct                            and retroflexion views. Recommendation:           - Patient has a contact number available for                             emergencies. The signs and symptoms of potential                            delayed complications were discussed with the                            patient. Return to normal activities tomorrow.                            Written discharge instructions were provided to the                            patient.                           - Resume previous diet.                           - Continue present medications.                           -  Await final pathology results for recommendations                            regarding any further testing.                           You will receive a letter within 2-3 weeks with the                            pathology results and my final recommendations. Rachael Fee, MD 05/07/2018 2:38:26 PM This report has been signed electronically.

## 2018-05-07 NOTE — Progress Notes (Signed)
To PACU, VSS. Report to [email or web address] 

## 2018-05-08 ENCOUNTER — Telehealth: Payer: Self-pay

## 2018-05-08 NOTE — Telephone Encounter (Signed)
  Follow up Call-  Call Jada Fass number 05/07/2018  Post procedure Call Lynisha Osuch phone  # [phone]  Permission to leave phone message Yes  Some recent data might be hidden     Patient questions:  Do you have a fever, pain , or abdominal swelling? No. Pain Score  0 *  Have you tolerated food without any problems? Yes.    Have you been able to return to your normal activities? Yes.    Do you have any questions about your discharge instructions: Diet   No. Medications  No. Follow up visit  No.  Do you have questions or concerns about your Care? No.  Actions: * If pain score is 4 or above: No action needed, pain <4.

## 2018-05-19 ENCOUNTER — Encounter: Payer: Self-pay | Admitting: Gastroenterology

## 2018-06-25 DIAGNOSIS — R69 Illness, unspecified: Secondary | ICD-10-CM | POA: Diagnosis not present

## 2018-07-15 DIAGNOSIS — R69 Illness, unspecified: Secondary | ICD-10-CM | POA: Diagnosis not present

## 2018-07-30 DIAGNOSIS — C61 Malignant neoplasm of prostate: Secondary | ICD-10-CM | POA: Diagnosis not present

## 2018-08-06 DIAGNOSIS — R351 Nocturia: Secondary | ICD-10-CM | POA: Diagnosis not present

## 2018-08-06 DIAGNOSIS — C61 Malignant neoplasm of prostate: Secondary | ICD-10-CM | POA: Diagnosis not present

## 2018-08-06 DIAGNOSIS — R350 Frequency of micturition: Secondary | ICD-10-CM | POA: Diagnosis not present

## 2018-11-26 ENCOUNTER — Other Ambulatory Visit: Payer: Self-pay

## 2018-11-26 ENCOUNTER — Encounter: Payer: Self-pay | Admitting: Family Medicine

## 2018-11-26 ENCOUNTER — Ambulatory Visit (INDEPENDENT_AMBULATORY_CARE_PROVIDER_SITE_OTHER): Payer: Medicare Other

## 2018-11-26 ENCOUNTER — Ambulatory Visit (INDEPENDENT_AMBULATORY_CARE_PROVIDER_SITE_OTHER): Payer: Medicare Other | Admitting: Family Medicine

## 2018-11-26 VITALS — BP 110/74 | HR 88 | Temp 97.9°F | Ht 71.5 in | Wt 185.2 lb

## 2018-11-26 DIAGNOSIS — M25561 Pain in right knee: Secondary | ICD-10-CM

## 2018-11-26 NOTE — Progress Notes (Signed)
f a Subjective:     Patient ID: Mark Pruitt, male   DOB: December 30, 1940, 78 y.o.   MRN: [id number]  HPI Patient is seen with several week history of progressive right knee pain.  Location is medial knee.  Denies any specific injury.  He has some stiffness.  His pain actually is worse with prolonged activity such as walking.  Pain is moderate.  He has not had any warmth or erythema.  Possibly some mild swelling.  No locking or giving way.  No significant alleviating factors.  No history of gout or pseudogout.  Past Medical History:  Diagnosis Date  . Cancer [facility])    prostate cancer  . Cataract   . COLONIC POLYPS 07/25/2009  . ELEVATED PROSTATE SPECIFIC ANTIGEN 07/27/2009  . Hearing loss of both ears   . HYPERTROPHY PROSTATE W/UR OBST & OTH LUTS 07/25/2009  . Irregular heart beat   . LICHEN SIMPLEX CHRONICUS 02/03/2009  . Other primary cardiomyopathies 08/08/2010   Past Surgical History:  Procedure Laterality Date  . COLONOSCOPY    . POLYPECTOMY    . PROSTATE SURGERY      reports that he quit smoking about 35 years ago. His smoking use included cigarettes. He has a 20.00 pack-year smoking history. He has never used smokeless tobacco. He reports that he does not drink alcohol or use drugs. family history includes Anuerysm (age of onset: 46) in his father; Cancer in his mother. No Known Allergies   Review of Systems  Constitutional: Negative for chills and fever.  Neurological: Negative for weakness.       Objective:   Physical Exam Constitutional:      Appearance: Normal appearance.  Cardiovascular:     Rate and Rhythm: Normal rate and regular rhythm.  Musculoskeletal:     Comments: Right knee reveals good range of motion.  He appears to have very small effusion.  No warmth.  No erythema.  No ecchymosis.  He does have some mild right medial joint line tenderness.  No lateral tenderness.  Ligament testing is normal  Neurological:     Mental Status: He is alert.         Assessment:     Progressive right knee pain.  No history of injury.  Question meniscal    Plan:     -Start with plain x-rays of the right knee.  He does not have any major arthritic change.  He has small fleck of calcification medial to the right knee.  No acute findings.  This will be over read  -Given duration of symptoms and progressive nature will set up orthopedic referral.  Avoid nonsteroidals with his age  Eulas Post MD [facility]

## 2018-11-26 NOTE — Patient Instructions (Signed)
We will set up orthopedic referral.

## 2018-11-27 ENCOUNTER — Encounter (INDEPENDENT_AMBULATORY_CARE_PROVIDER_SITE_OTHER): Payer: Self-pay | Admitting: Family Medicine

## 2018-11-27 ENCOUNTER — Ambulatory Visit (INDEPENDENT_AMBULATORY_CARE_PROVIDER_SITE_OTHER): Payer: Medicare Other | Admitting: Family Medicine

## 2018-11-27 DIAGNOSIS — M25561 Pain in right knee: Secondary | ICD-10-CM

## 2018-11-27 MED ORDER — NABUMETONE 750 MG PO TABS: 750.0000 mg | ORAL_TABLET | Freq: Two times a day (BID) | ORAL | 6 refills | Status: DC | PRN

## 2018-11-27 NOTE — Progress Notes (Signed)
Office Visit Note   Patient: Mark Pruitt           Date of Birth: 1941/04/10           MRN: [id number] Visit Date: 11/27/2018 Requested by: Eulas Post, MD [place], [place] [address] PCP: Eulas Post, MD  Subjective: Chief Complaint  Patient presents with  . Right Knee - Pain    Medial knee pain x several weeks, with worsening pain this past week. NKI. Clicks. Worsens as the day goes on, with walking. Some swelling.    HPI: He is a 78 year old with right knee pain.  Symptoms started a few weeks ago, no definite injury.  He started having intermittent pain on the medial aspect of his knee but it has gotten steadily worse so that it now bothers him walking and sometimes even hurts at night.  It pops frequently but he has not had any locking.  No history of gout or other arthritic conditions.  He has tried heat which has not really helped.  He had x-rays done recently and now presents for further evaluation.              ROS: He had prostate cancer treated successfully 10 years ago.  He has hypertension well controlled.  All other systems were reviewed and are negative.  Objective: Vital Signs: There were no vitals taken for this visit.  Physical Exam:  Right knee: 1+ effusion, no warmth or erythema.  1+ patellofemoral crepitus but no pain with patella compression.  No laxity with varus or valgus stress, Lockman's is solid.  Exquisitely tender on the posterior medial joint line with pain and a palpable click on McMurray's.  Slight lateral joint line tenderness as well.   Imaging: None today but recent x-rays show mild to moderate medial compartment narrowing with periarticular spurring and a possible loose body near the medial tibial plateau.  Assessment & Plan: 1.  Right knee pain, possible degenerative medial meniscus tear versus loose body. -Discussed options with patient, elected to try a short course of an anti-inflammatory and also  schedule an MRI scan.  Depending on the results of the MRI, if he fails to improve with anti-inflammatory could try a cortisone injection if there is no indication for surgery.     Procedures: No procedures performed  No notes on file     PMFS History: Patient Active Problem List   Diagnosis Date Noted  . Colon adenomas 01/15/2018  . Lightheadedness 01/15/2018  . Seborrheic keratoses 01/15/2018  . Angioma 01/15/2018  . SIRS (systemic inflammatory response syndrome) ([place]) 04/15/2016  . Tick bite 04/15/2016  . Fever 04/15/2016  . Prostate cancer ([place]) 11/22/2011  . History of cardiomyopathy 08/08/2010  . ELEVATED PROSTATE SPECIFIC ANTIGEN 07/27/2009  . COLONIC POLYPS 07/25/2009  . HYPERTROPHY PROSTATE W/UR OBST & OTH LUTS 07/25/2009  . LICHEN SIMPLEX CHRONICUS 02/03/2009   Past Medical History:  Diagnosis Date  . Cancer [facility])    prostate cancer  . Cataract   . COLONIC POLYPS 07/25/2009  . ELEVATED PROSTATE SPECIFIC ANTIGEN 07/27/2009  . Hearing loss of both ears   . HYPERTROPHY PROSTATE W/UR OBST & OTH LUTS 07/25/2009  . Irregular heart beat   . LICHEN SIMPLEX CHRONICUS 02/03/2009  . Other primary cardiomyopathies 08/08/2010    Family History  Problem Relation Age of Onset  . Cancer Mother        parathyroid adenoma  . Anuerysm Father 65  abdominal  . Arthritis Neg Hx        family hx  . Colon cancer Neg Hx   . Esophageal cancer Neg Hx   . Rectal cancer Neg Hx   . Stomach cancer Neg Hx     Past Surgical History:  Procedure Laterality Date  . COLONOSCOPY    . POLYPECTOMY    . PROSTATE SURGERY     Social History   Occupational History  . Not on file  Tobacco Use  . Smoking status: Former Smoker    Packs/day: 1.00    Years: 20.00    Pack years: 20.00    Types: Cigarettes    Last attempt to quit: 10/22/1983    Years since quitting: 35.1  . Smokeless tobacco: Never Used  Substance and Sexual Activity  . Alcohol use: No    Alcohol/week: 0.0 standard  drinks  . Drug use: No  . Sexual activity: Not on file

## 2018-12-03 ENCOUNTER — Ambulatory Visit
Admission: RE | Admit: 2018-12-03 | Discharge: 2018-12-03 | Disposition: A | Discharge status: Home / Self Care | Payer: Medicare Other | Source: Ambulatory Visit | Attending: Family Medicine | Admitting: Family Medicine

## 2018-12-03 ENCOUNTER — Telehealth (INDEPENDENT_AMBULATORY_CARE_PROVIDER_SITE_OTHER): Payer: Self-pay | Admitting: Family Medicine

## 2018-12-03 DIAGNOSIS — M23331 Other meniscus derangements, other medial meniscus, right knee: Secondary | ICD-10-CM | POA: Diagnosis not present

## 2018-12-03 DIAGNOSIS — M25461 Effusion, right knee: Secondary | ICD-10-CM | POA: Diagnosis not present

## 2018-12-03 DIAGNOSIS — M25561 Pain in right knee: Secondary | ICD-10-CM

## 2018-12-03 NOTE — Telephone Encounter (Signed)
MRI shows a torn medial meniscus cartilage and also a loose body near the joint.  If he's still having locking and catching symptoms, we will have him meet with one of the surgeons to discuss arthroscopic surgery.  There is also a moderate amount of arthritis in the knee.  If the pain is more of an aching, throbbing pain, we could try a cortisone injection instead.

## 2018-12-04 NOTE — Telephone Encounter (Signed)
I called and advised the patient of the results. He is not having any locking/catching symptoms. He chose to try the cortisone injection. Appointment scheduled for tomorrow morning with Dr. Junius Roads.

## 2018-12-05 ENCOUNTER — Encounter (INDEPENDENT_AMBULATORY_CARE_PROVIDER_SITE_OTHER): Payer: Self-pay | Admitting: Family Medicine

## 2018-12-05 ENCOUNTER — Ambulatory Visit (INDEPENDENT_AMBULATORY_CARE_PROVIDER_SITE_OTHER): Payer: Medicare Other | Admitting: Family Medicine

## 2018-12-05 DIAGNOSIS — M25561 Pain in right knee: Secondary | ICD-10-CM | POA: Diagnosis not present

## 2018-12-05 MED ORDER — METHYLPREDNISOLONE ACETATE 40 MG/ML IJ SUSP: 40.0000 mg | Freq: Once | INTRAMUSCULAR | Status: DC

## 2018-12-05 NOTE — Progress Notes (Signed)
Office Visit Note   Patient: Mark Pruitt           Date of Birth: [birth year]           MRN: [id number] Visit Date: 12/05/2018 Requested by: Eulas Post, MD [place], [place] [address] PCP: Eulas Post, MD  Subjective: Chief Complaint  Patient presents with  . Right Knee - Pain, Follow-up    Cortisone injection - would like to discuss first.    HPI: He is here to discuss MRI results.  MRI showed medial meniscus tear, loose body, and some arthritis.  He is not having any locking symptoms but occasional sharp pain on the medial aspect, occasional aching and stiffness.  He wonders whether an injection would eliminate his pain or delay the inevitable surgery.              ROS: Noncontributory  Objective: Vital Signs: There were no vitals taken for this visit.  Physical Exam:  Right knee: Trace effusion, no warmth or erythema.  Tender on the medial joint line.  Imaging: None today.  Assessment & Plan: 1.  Right knee pain with medial meniscus tear, loose body and arthritis. -We discussed treatment options, there is certainly not a guarantee that this will eliminate his pain but he wants to try a cortisone injection.  If he fails to get relief then we will have him consult with Dr. Marlou Sa for possible arthroscopic debridement.     Procedures: Right knee steroid injection: After sterile prep with Betadine, injected 3 cc 1% lidocaine without epinephrine and 40 mg methylprednisolone from lateral midpatellar approach, a flash of synovial fluid was obtained prior to injection.    PMFS History: Patient Active Problem List   Diagnosis Date Noted  . Colon adenomas 01/15/2018  . Lightheadedness 01/15/2018  . Seborrheic keratoses 01/15/2018  . Angioma 01/15/2018  . SIRS (systemic inflammatory response syndrome) ([place]) 04/15/2016  . Tick bite 04/15/2016  . Fever 04/15/2016  . Prostate cancer ([place]) 11/22/2011  . History of cardiomyopathy  08/08/2010  . ELEVATED PROSTATE SPECIFIC ANTIGEN 07/27/2009  . COLONIC POLYPS 07/25/2009  . HYPERTROPHY PROSTATE W/UR OBST & OTH LUTS 07/25/2009  . LICHEN SIMPLEX CHRONICUS 02/03/2009   Past Medical History:  Diagnosis Date  . Cancer [facility])    prostate cancer  . Cataract   . COLONIC POLYPS 07/25/2009  . ELEVATED PROSTATE SPECIFIC ANTIGEN 07/27/2009  . Hearing loss of both ears   . HYPERTROPHY PROSTATE W/UR OBST & OTH LUTS 07/25/2009  . Irregular heart beat   . LICHEN SIMPLEX CHRONICUS 02/03/2009  . Other primary cardiomyopathies 08/08/2010    Family History  Problem Relation Age of Onset  . Cancer Mother        parathyroid adenoma  . Anuerysm Father 71       abdominal  . Arthritis Neg Hx        family hx  . Colon cancer Neg Hx   . Esophageal cancer Neg Hx   . Rectal cancer Neg Hx   . Stomach cancer Neg Hx     Past Surgical History:  Procedure Laterality Date  . COLONOSCOPY    . POLYPECTOMY    . PROSTATE SURGERY     Social History   Occupational History  . Not on file  Tobacco Use  . Smoking status: Former Smoker    Packs/day: 1.00    Years: 20.00    Pack years: 20.00    Types: Cigarettes  Last attempt to quit: 10/22/1983    Years since quitting: 35.1  . Smokeless tobacco: Never Used  Substance and Sexual Activity  . Alcohol use: No    Alcohol/week: 0.0 standard drinks  . Drug use: No  . Sexual activity: Not on file

## 2019-04-13 DIAGNOSIS — H10413 Chronic giant papillary conjunctivitis, bilateral: Secondary | ICD-10-CM | POA: Diagnosis not present

## 2019-04-13 DIAGNOSIS — Z961 Presence of intraocular lens: Secondary | ICD-10-CM | POA: Diagnosis not present

## 2019-04-13 DIAGNOSIS — H26491 Other secondary cataract, right eye: Secondary | ICD-10-CM | POA: Diagnosis not present

## 2019-04-13 DIAGNOSIS — H43813 Vitreous degeneration, bilateral: Secondary | ICD-10-CM | POA: Diagnosis not present

## 2019-04-20 DIAGNOSIS — H26492 Other secondary cataract, left eye: Secondary | ICD-10-CM | POA: Diagnosis not present

## 2019-05-02 ENCOUNTER — Other Ambulatory Visit: Payer: Self-pay | Admitting: Family Medicine

## 2019-05-04 ENCOUNTER — Telehealth: Payer: Self-pay | Admitting: Family Medicine

## 2019-05-04 NOTE — Telephone Encounter (Signed)
Medication Refill - Medication: lisinopril (ZESTRIL) 5 MG tablet 90 DAY SUPPLY   Has the patient contacted their pharmacy?Yes (Agent: If no, request that the patient contact the pharmacy for the refill.) (Agent: If yes, when and what did the pharmacy advise?) Contact PCP  Preferred Pharmacy (with phone number or street name):  [place], [place] [phone] (Phone) [phone] (Fax)     Agent: Please be advised that RX refills may take up to 3 business days. We ask that you follow-up with your pharmacy.

## 2019-05-07 NOTE — Telephone Encounter (Signed)
Patient called in stating pharmacy has not received 90 day supply at [facility]. Patient stated he canceled the one for the [facility] for 30 days and is wanting the 90 instead. Please advise. Call back is [phone].

## 2019-05-07 NOTE — Telephone Encounter (Signed)
See note

## 2019-05-08 ENCOUNTER — Other Ambulatory Visit: Payer: Self-pay

## 2019-05-08 MED ORDER — LISINOPRIL 5 MG PO TABS: 5.0000 mg | ORAL_TABLET | Freq: Every day | ORAL | 0 refills | Status: DC

## 2019-05-08 NOTE — Telephone Encounter (Signed)
A 90 day refill has been sent in for patient to [facility].

## 2019-05-18 ENCOUNTER — Encounter: Payer: Self-pay | Admitting: Family Medicine

## 2019-05-18 ENCOUNTER — Ambulatory Visit (INDEPENDENT_AMBULATORY_CARE_PROVIDER_SITE_OTHER): Payer: Medicare Other | Admitting: Family Medicine

## 2019-05-18 ENCOUNTER — Other Ambulatory Visit: Payer: Self-pay

## 2019-05-18 VITALS — BP 112/70 | HR 73 | Temp 97.4°F | Ht 71.0 in | Wt 183.3 lb

## 2019-05-18 DIAGNOSIS — Z0000 Encounter for general adult medical examination without abnormal findings: Secondary | ICD-10-CM

## 2019-05-18 DIAGNOSIS — S0081XA Abrasion of other part of head, initial encounter: Secondary | ICD-10-CM | POA: Diagnosis not present

## 2019-05-18 DIAGNOSIS — Z23 Encounter for immunization: Secondary | ICD-10-CM

## 2019-05-18 LAB — CBC WITH DIFFERENTIAL/PLATELET
Basophils Absolute: 0 K/uL (ref 0.0–0.1)
Basophils Relative: 0.7 % (ref 0.0–3.0)
Eosinophils Absolute: 0.1 K/uL (ref 0.0–0.7)
Eosinophils Relative: 0.8 % (ref 0.0–5.0)
HCT: 45.8 % (ref 39.0–52.0)
Hemoglobin: 15.2 g/dL (ref 13.0–17.0)
Lymphocytes Relative: 24.5 % (ref 12.0–46.0)
Lymphs Abs: 1.7 K/uL (ref 0.7–4.0)
MCHC: 33.2 g/dL (ref 30.0–36.0)
MCV: 92.7 fl (ref 78.0–100.0)
Monocytes Absolute: 0.6 K/uL (ref 0.1–1.0)
Monocytes Relative: 8.8 % (ref 3.0–12.0)
Neutro Abs: 4.5 K/uL (ref 1.4–7.7)
Neutrophils Relative %: 65.2 % (ref 43.0–77.0)
Platelets: 155 K/uL (ref 150.0–400.0)
RBC: 4.94 Mil/uL (ref 4.22–5.81)
RDW: 14.3 % (ref 11.5–15.5)
WBC: 7 K/uL (ref 4.0–10.5)

## 2019-05-18 LAB — BASIC METABOLIC PANEL WITH GFR
BUN: 20 mg/dL (ref 6–23)
CO2: 29 meq/L (ref 19–32)
Calcium: 9.8 mg/dL (ref 8.4–10.5)
Chloride: 104 meq/L (ref 96–112)
Creatinine, Ser: 1.08 mg/dL (ref 0.40–1.50)
GFR: 65.83 mL/min (ref >60.00)
Glucose, Bld: 100 mg/dL — ABNORMAL HIGH (ref 70–99)
Potassium: 4.8 meq/L (ref 3.5–5.1)
Sodium: 140 meq/L (ref 135–145)

## 2019-05-18 LAB — LIPID PANEL
Cholesterol: 186 mg/dL (ref 0–200)
HDL: 64.3 mg/dL (ref >39.00)
LDL Cholesterol: 105 mg/dL — ABNORMAL HIGH (ref 0–99)
NonHDL: 121.21
Total CHOL/HDL Ratio: 3
Triglycerides: 82 mg/dL (ref 0.0–149.0)
VLDL: 16.4 mg/dL (ref 0.0–40.0)

## 2019-05-18 LAB — HEPATIC FUNCTION PANEL
ALT: 13 U/L (ref 0–53)
AST: 16 U/L (ref 0–37)
Albumin: 4.5 g/dL (ref 3.5–5.2)
Alkaline Phosphatase: 48 U/L (ref 39–117)
Bilirubin, Direct: 0.2 mg/dL (ref 0.0–0.3)
Total Bilirubin: 1 mg/dL (ref 0.2–1.2)
Total Protein: 7.2 g/dL (ref 6.0–8.3)

## 2019-05-18 LAB — TSH: TSH: 1.84 u[IU]/mL (ref 0.35–4.50)

## 2019-05-18 NOTE — Progress Notes (Signed)
Subjective:     Patient ID: Mark Pruitt, male   DOB: Aug 13, 1941, 78 y.o.   MRN: [id number]  HPI Patient is here for physical exam.  He has history of cardiomyopathy-(nonischemic) several years ago and is done very well since then.  He has been on low-dose lisinopril 5 mg daily since that time.  Does not get a lot of regular exercise but does some walking daily and has had no dyspnea with exertion.  Recent issues with right knee pain.  Went to orthopedics and had MRI which showed medial meniscus tear.  Slowly improving.  Not limiting activities much.  Past history of prostate cancer.  Has been followed closely for several years by urology.  He also has history of colon adenomas.  Quit smoking around age 72.  Approximately 30-pack-year history.  Recent abrasion left side of face.  Scratch by a tree limb with small puncture.  Last tetanus over 10 years ago.  Injury occurred couple days ago.  History of Zostavax 2009.  He has had both previous pneumonia vaccines.  Past Medical History:  Diagnosis Date  . Cancer [facility])    prostate cancer  . Cataract   . COLONIC POLYPS 07/25/2009  . ELEVATED PROSTATE SPECIFIC ANTIGEN 07/27/2009  . Hearing loss of both ears   . HYPERTROPHY PROSTATE W/UR OBST & OTH LUTS 07/25/2009  . Irregular heart beat   . LICHEN SIMPLEX CHRONICUS 02/03/2009  . Other primary cardiomyopathies 08/08/2010   Past Surgical History:  Procedure Laterality Date  . COLONOSCOPY    . POLYPECTOMY    . PROSTATE SURGERY      reports that he quit smoking about 35 years ago. His smoking use included cigarettes. He has a 20.00 pack-year smoking history. He has never used smokeless tobacco. He reports that he does not drink alcohol or use drugs. family history includes Anuerysm (age of onset: 64) in his father; Cancer in his mother. No Known Allergies   Review of Systems  Constitutional: Negative for activity change, appetite change, fatigue and fever.  HENT: Negative for  congestion, ear pain and trouble swallowing.   Eyes: Negative for pain and visual disturbance.  Respiratory: Negative for cough, shortness of breath and wheezing.   Cardiovascular: Negative for chest pain and palpitations.  Gastrointestinal: Negative for abdominal distention, abdominal pain, blood in stool, constipation, diarrhea, nausea, rectal pain and vomiting.  Genitourinary: Negative for dysuria, hematuria and testicular pain.  Musculoskeletal: Negative for arthralgias and joint swelling.  Skin: Negative for rash.  Neurological: Negative for dizziness, syncope and headaches.  Hematological: Negative for adenopathy.  Psychiatric/Behavioral: Negative for confusion and dysphoric mood.       Objective:   Physical Exam Constitutional:      General: He is not in acute distress.    Appearance: He is well-developed.  HENT:     Head: Normocephalic and atraumatic.     Right Ear: External ear normal.     Left Ear: External ear normal.  Eyes:     Conjunctiva/sclera: Conjunctivae normal.     Pupils: Pupils are equal, round, and reactive to light.  Neck:     Musculoskeletal: Normal range of motion and neck supple.     Thyroid: No thyromegaly.  Cardiovascular:     Rate and Rhythm: Normal rate and regular rhythm.     Heart sounds: Normal heart sounds. No murmur.  Pulmonary:     Effort: No respiratory distress.     Breath sounds: No wheezing or rales.  Abdominal:  General: Bowel sounds are normal. There is no distension.     Palpations: Abdomen is soft. There is no mass.     Tenderness: There is no abdominal tenderness. There is no guarding or rebound.  Lymphadenopathy:     Cervical: No cervical adenopathy.  Skin:    Comments: He has deep abrasion left cheek area.  No foreign bodies noted.  No surrounding erythema.  Neurological:     Mental Status: He is alert and oriented to person, place, and time.     Cranial Nerves: No cranial nerve deficit.     Deep Tendon Reflexes: Reflexes  normal.        Assessment:     Physical exam.  Patient doing well overall.  Past history of prostate cancer over 10 years ago.  He does have abrasion left side of face but no signs of secondary infection    Plan:     -Tetanus booster given -Continue with annual flu vaccine -Obtain follow-up labs -Continue regular exercise such as walking  Eulas Post MD [facility]

## 2019-05-18 NOTE — Addendum Note (Signed)
Addended by: Sheffield Slider L on: 05/18/2019 11:10 AM   Modules accepted: Orders

## 2019-06-10 ENCOUNTER — Other Ambulatory Visit: Payer: Self-pay | Admitting: Family Medicine

## 2019-10-13 ENCOUNTER — Ambulatory Visit: Payer: Medicare Other | Attending: Internal Medicine

## 2019-10-13 DIAGNOSIS — Z23 Encounter for immunization: Secondary | ICD-10-CM | POA: Insufficient documentation

## 2019-10-13 NOTE — Progress Notes (Signed)
   Covid-19 Vaccination Clinic  Name:  Mark Pruitt    MRN: [id number] DOB: [birth year]  10/13/2019  [name] was observed post Covid-19 immunization for 15 minutes without incidence. He was provided with Vaccine Information Sheet and instruction to access the V-Safe system.   [name] was instructed to call 911 with any severe reactions post vaccine: Marland Kitchen Difficulty breathing  . Swelling of your face and throat  . A fast heartbeat  . A bad rash all over your body  . Dizziness and weakness    Immunizations Administered    Name Date Dose VIS Date Route   Pfizer COVID-19 Vaccine 10/13/2019 12:36 PM 0.3 mL 09/11/2019 Intramuscular   Manufacturer: [place]   Lot: [id number]   [place]: [id number]

## 2019-10-15 ENCOUNTER — Ambulatory Visit: Payer: Medicare Other

## 2019-10-27 ENCOUNTER — Telehealth (INDEPENDENT_AMBULATORY_CARE_PROVIDER_SITE_OTHER): Payer: Medicare Other | Admitting: Family Medicine

## 2019-10-27 ENCOUNTER — Ambulatory Visit: Payer: Medicare Other | Attending: Internal Medicine

## 2019-10-27 ENCOUNTER — Other Ambulatory Visit: Payer: Self-pay

## 2019-10-27 DIAGNOSIS — Z20822 Contact with and (suspected) exposure to covid-19: Secondary | ICD-10-CM

## 2019-10-27 DIAGNOSIS — R509 Fever, unspecified: Secondary | ICD-10-CM | POA: Diagnosis not present

## 2019-10-27 DIAGNOSIS — R52 Pain, unspecified: Secondary | ICD-10-CM

## 2019-10-27 NOTE — Progress Notes (Signed)
This visit type was conducted due to national recommendations for restrictions regarding the COVID-19 pandemic in an effort to limit this patient's exposure and mitigate transmission in our community.   Virtual Visit via Telephone Note  I connected with Mark Pruitt on 10/27/19 at  4:15 PM EST by telephone and verified that I am speaking with the correct person using two identifiers.   I discussed the limitations, risks, security and privacy concerns of performing an evaluation and management service by telephone and the availability of in person appointments. I also discussed with the patient that there may be a patient responsible charge related to this service. The patient expressed understanding and agreed to proceed.  Location patient: home Location provider: work or home office Participants present for the call: patient, provider Patient did not have a visit in the prior 7 days to address this/these issue(s).   History of Present Illness: Ms. Nghiem developed onset last night of some chills and body aches.  But this morning he had some nausea without vomiting and temperature as high as 101.  He is also reported some headaches.  He had some normal stool this morning but by the end of the day increased loose and then diarrhea stool.  Denies any abdominal pain.  No dyspnea.  No cough.  No nasal congestion.  No sore throat.  No dysuria.  He states he had his first Covid vaccination on the 12th.  His wife is asymptomatic.  He has been generally very isolated with exception of going to the grocery store.  He has been keeping fluids down today.  No rash.  Past Medical History:  Diagnosis Date  . Cancer [facility])    prostate cancer  . Cataract   . COLONIC POLYPS 07/25/2009  . ELEVATED PROSTATE SPECIFIC ANTIGEN 07/27/2009  . Hearing loss of both ears   . HYPERTROPHY PROSTATE W/UR OBST & OTH LUTS 07/25/2009  . Irregular heart beat   . LICHEN SIMPLEX CHRONICUS 02/03/2009  . Other primary  cardiomyopathies 08/08/2010   Past Surgical History:  Procedure Laterality Date  . COLONOSCOPY    . POLYPECTOMY    . PROSTATE SURGERY      reports that he quit smoking about 36 years ago. His smoking use included cigarettes. He has a 20.00 pack-year smoking history. He has never used smokeless tobacco. He reports that he does not drink alcohol or use drugs. family history includes Anuerysm (age of onset: 37) in his father; Cancer in his mother. No Known Allergies    Observations/Objective: Patient sounds cheerful and well on the phone. I do not appreciate any SOB. Speech and thought processing are grossly intact. Patient reported vitals:  Assessment and Plan: Patient is had 1 day history of fever, chills, headache, body aches.  He went for Covid testing earlier today.  He is in no respiratory distress at this time.  [facility] of fluids and rest -Stay quarantined until further clarified -We gave him option of our respiratory clinic Monday through Friday 5 30-7 30 but he would like to wait and observe this time.  He knows to go to ER for any increased shortness of breath or other concerns  Follow Up Instructions:  -As above   99441 5-10 [id number] 11-20 [id number] 21-30 I did not refer this patient for an OV in the next 24 hours for this/these issue(s).  I discussed the assessment and treatment plan with the patient. The patient was provided an opportunity to ask questions and all were answered. The patient  agreed with the plan and demonstrated an understanding of the instructions.   The patient was advised to call back or seek an in-person evaluation if the symptoms worsen or if the condition fails to improve as anticipated.  I provided 17 minutes of non-face-to-face time during this encounter.   Carolann Littler, MD

## 2019-10-28 ENCOUNTER — Telehealth: Payer: Self-pay | Admitting: Family Medicine

## 2019-10-28 LAB — NOVEL CORONAVIRUS, NAA: SARS-CoV-2, NAA: NOT DETECTED

## 2019-10-28 NOTE — Telephone Encounter (Signed)
He should continue with plenty of fluids and rest and observe for any recurrent fever.  This was probably another viral infection other than Covid.  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

## 2019-10-28 NOTE — Telephone Encounter (Signed)
I think this should be evaluated here.  Cannot really reasonably refer to specialist without helping figure out where the source of bulge is.  Need to rule out hernia by history

## 2019-10-28 NOTE — Telephone Encounter (Signed)
I called the patient and gave him the message. Patient verbalized an understanding.  Patient and his wife stated that he has a bulge in his groin and he is worried about having someone look at this. Patient wants to know if he should go to a specialist as they both stated that his father passed away from an aneurysm in his kidney and he is worried that this may be an aneurysm.  Please advise.

## 2019-10-28 NOTE — Telephone Encounter (Signed)
Please see message. °

## 2019-10-28 NOTE — Telephone Encounter (Signed)
Pt is calling back in stating that he has spoken with Dr. Elease Hashimoto and he wanted to let him know that his COVID results are negative.  Fever has subsided and wanted to know what to do going forward.  Pt would like to have a call back.

## 2019-10-30 ENCOUNTER — Ambulatory Visit (INDEPENDENT_AMBULATORY_CARE_PROVIDER_SITE_OTHER): Payer: Medicare Other | Admitting: Family Medicine

## 2019-10-30 ENCOUNTER — Encounter: Payer: Self-pay | Admitting: Family Medicine

## 2019-10-30 ENCOUNTER — Other Ambulatory Visit: Payer: Self-pay

## 2019-10-30 VITALS — BP 112/64 | HR 73 | Temp 97.6°F | Ht 71.0 in | Wt 182.2 lb

## 2019-10-30 DIAGNOSIS — R1909 Other intra-abdominal and pelvic swelling, mass and lump: Secondary | ICD-10-CM | POA: Diagnosis not present

## 2019-10-30 NOTE — Telephone Encounter (Signed)
Called patient and made an appointment for today per Dr. Elease Hashimoto.

## 2019-10-30 NOTE — Progress Notes (Signed)
  Subjective:     Patient ID: Mark Pruitt, male   DOB: 12-Apr-1941, 79 y.o.   MRN: [id number]  HPI   Mark Pruitt had called with concerns about a possible "aneurysm ".  He states his father died of renal artery aneurysm rupture at age 59.  His symptoms include onset a couple days ago bulge right inguinal area associate with mild pain.  Symptoms are actually better today.  Swelling is down some.  No prior history of hernia.  Denies any recent injury.  Did have recent acute fever which lasted only 12 hours.  Covid testing was negative.  He had absolutely no fever since then.  No respiratory symptoms.  No dysuria.  No abdominal pain.  Past Medical History:  Diagnosis Date  . Cancer [facility])    prostate cancer  . Cataract   . COLONIC POLYPS 07/25/2009  . ELEVATED PROSTATE SPECIFIC ANTIGEN 07/27/2009  . Hearing loss of both ears   . HYPERTROPHY PROSTATE W/UR OBST & OTH LUTS 07/25/2009  . Irregular heart beat   . LICHEN SIMPLEX CHRONICUS 02/03/2009  . Other primary cardiomyopathies 08/08/2010   Past Surgical History:  Procedure Laterality Date  . COLONOSCOPY    . POLYPECTOMY    . PROSTATE SURGERY      reports that he quit smoking about 36 years ago. His smoking use included cigarettes. He has a 20.00 pack-year smoking history. He has never used smokeless tobacco. He reports that he does not drink alcohol or use drugs. family history includes Anuerysm (age of onset: 56) in his father; Cancer in his mother. No Known Allergies   Review of Systems  Constitutional: Negative for chills and fever.  Respiratory: Negative for shortness of breath.   Gastrointestinal: Negative for abdominal pain.  Genitourinary: Negative for dysuria.       Objective:   Physical Exam Vitals reviewed.  Constitutional:      Appearance: Normal appearance.  Cardiovascular:     Rate and Rhythm: Normal rate and regular rhythm.  Pulmonary:     Effort: Pulmonary effort is normal.     Breath sounds:  Normal breath sounds.  Genitourinary:    Comments: Right inguinal region is examined.  No obvious bulge.  He does appear to have a small area of weakness which likely corresponds to inguinal hernia and minimal bulge with coughing and straining.  This is not palpated through the scrotum. Neurological:     Mental Status: He is alert.        Assessment:     Probable small early right inguinal hernia    Plan:     -Reviewed signs and symptoms of incarceration -He wishes to observe at this point.  Consider referral to general surgeon if he has any recurrent pain or increased swelling.  He is aware this will likely increase over time  Mark Post MD [facility] at [facility]

## 2019-10-30 NOTE — Patient Instructions (Signed)

## 2019-10-31 ENCOUNTER — Ambulatory Visit: Payer: Medicare Other | Attending: Internal Medicine

## 2019-10-31 DIAGNOSIS — Z23 Encounter for immunization: Secondary | ICD-10-CM

## 2019-10-31 NOTE — Progress Notes (Signed)
   Covid-19 Vaccination Clinic  Name:  Mark Pruitt    MRN: [id number] DOB: [birth year]  10/31/2019  [name] was observed post Covid-19 immunization for 15 minutes without incidence. He was provided with Vaccine Information Sheet and instruction to access the V-Safe system.   [name] was instructed to call 911 with any severe reactions post vaccine: Marland Kitchen Difficulty breathing  . Swelling of your face and throat  . A fast heartbeat  . A bad rash all over your body  . Dizziness and weakness    Immunizations Administered    Name Date Dose VIS Date Route   Pfizer COVID-19 Vaccine 10/31/2019 11:26 AM 0.3 mL 09/11/2019 Intramuscular   Manufacturer: [place]   Lot: [id number]   [place]: [id number]

## 2019-11-19 ENCOUNTER — Other Ambulatory Visit: Payer: Self-pay | Admitting: Family Medicine

## 2019-11-30 ENCOUNTER — Other Ambulatory Visit: Payer: Self-pay

## 2019-11-30 ENCOUNTER — Ambulatory Visit (INDEPENDENT_AMBULATORY_CARE_PROVIDER_SITE_OTHER): Payer: Medicare Other | Admitting: Family Medicine

## 2019-11-30 ENCOUNTER — Encounter: Payer: Self-pay | Admitting: Family Medicine

## 2019-11-30 VITALS — BP 100/70 | HR 87 | Temp 97.5°F | Ht 71.0 in | Wt 183.0 lb

## 2019-11-30 DIAGNOSIS — R000 Tachycardia, unspecified: Secondary | ICD-10-CM | POA: Diagnosis not present

## 2019-11-30 DIAGNOSIS — R079 Chest pain, unspecified: Secondary | ICD-10-CM | POA: Diagnosis not present

## 2019-11-30 NOTE — Progress Notes (Signed)
Subjective:     Patient ID: Mark Pruitt, male   DOB: [birth year], 79 y.o.   MRN: [id number]  HPI   [name] is seen following episode of transient chest discomfort and possible tachypalpitations Saturday evening.  He states that he ate dinner and about 30 minutes later was sitting down to watch television.  He noticed some fairly diffuse chest pressure substernally which last about 15 minutes.  He noticed some discomfort going down the left arm.  He denied any dyspnea.  No diaphoresis.  No nausea or vomiting.  He had a pulse oximeter and states that his oxygen levels were decent but this was recording a heart rate of 180s.  His wife tried to palpate his pulse and did palpate a pulse around 90 for 30 seconds which would have correlated with a heart rate of 180.  He states that this did come down but persisted up around 120 for about 2 hours.  He did not go to the ER or seek any further evaluation at that point.  He drinks some coffee at baseline.  He did recall having coffee, soda and tea that day which was slightly more caffeine than usual.  Denies any recent GERD symptoms.  No cough.  No fevers or chills.  No pleuritic pain.  Quit smoking 1985.  No history of diabetes.  He had some nonspecific fatigue yesterday but states he feels fine and back to baseline today.  He had no further episodes of chest discomfort whatsoever since the episode Saturday night.  He states that when he lived in [place] he had an event monitor that was several years ago-which was unremarkable.  He had echocardiogram 2012 which showed only mild mitral regurg and aortic regurg.  He is not aware of any prior history of stress testing.  Generally plays golf once per week and usually walks his dogs 1 to 2 miles per day but is never had any discomfort with that.  He does not have any known cardiac history.  He takes low-dose lisinopril 5 mg daily but no other regular prescription medications.  Past Medical History:   Diagnosis Date  . Cancer [facility])    prostate cancer  . Cataract   . COLONIC POLYPS 07/25/2009  . ELEVATED PROSTATE SPECIFIC ANTIGEN 07/27/2009  . Hearing loss of both ears   . HYPERTROPHY PROSTATE W/UR OBST & OTH LUTS 07/25/2009  . Irregular heart beat   . LICHEN SIMPLEX CHRONICUS 02/03/2009  . Other primary cardiomyopathies 08/08/2010   Past Surgical History:  Procedure Laterality Date  . COLONOSCOPY    . POLYPECTOMY    . PROSTATE SURGERY      reports that he quit smoking about 36 years ago. His smoking use included cigarettes. He has a 20.00 pack-year smoking history. He has never used smokeless tobacco. He reports that he does not drink alcohol or use drugs. family history includes Anuerysm (age of onset: 13) in his father; Cancer in his mother. No Known Allergies   Review of Systems  Constitutional: Negative for appetite change, chills, fatigue, fever and unexpected weight change.  HENT: Negative for trouble swallowing.   Eyes: Negative for visual disturbance.  Respiratory: Positive for chest tightness. Negative for cough and shortness of breath.   Cardiovascular: Positive for chest pain. Negative for palpitations and leg swelling.  Gastrointestinal: Negative for abdominal pain.  Endocrine: Negative for polydipsia and polyuria.  Neurological: Negative for dizziness, syncope, weakness, light-headedness and headaches.       Objective:  Physical Exam Vitals reviewed.  Constitutional:      Appearance: Normal appearance.  Cardiovascular:     Rate and Rhythm: Normal rate.     Comments: Occasional premature beat.  Rate is normal Pulmonary:     Effort: Pulmonary effort is normal.     Breath sounds: Normal breath sounds.  Musculoskeletal:     Right lower leg: No edema.     Left lower leg: No edema.  Neurological:     Mental Status: He is alert.        Assessment:     Patient had recent 15-minute episode of chest tightness with possible associated  tachypalpitations/tachycardia this past Saturday night.  Has had no episodes since then.  No known history of CAD.  Denied any classic reflux type symptoms.    Plan:     -Check EKG-shows sinus rhythm with occasional PVCs.  No acute ST-T abnormalities -Needs further evaluation and will set up cardiology referral -Avoid any heavy exertional activities until further evaluated -Continue aspirin 81 mg daily -Call 911 or go to the ER immediately for any recurrent symptoms -Minimize caffeine intake  Eulas Post MD [facility] at [facility]

## 2019-11-30 NOTE — Patient Instructions (Signed)

## 2019-12-03 ENCOUNTER — Other Ambulatory Visit: Payer: Self-pay

## 2019-12-03 ENCOUNTER — Ambulatory Visit: Payer: Medicare Other | Admitting: Cardiology

## 2019-12-03 ENCOUNTER — Encounter: Payer: Self-pay | Admitting: *Deleted

## 2019-12-03 ENCOUNTER — Encounter: Payer: Self-pay | Admitting: Cardiology

## 2019-12-03 ENCOUNTER — Telehealth: Payer: Self-pay | Admitting: Radiology

## 2019-12-03 VITALS — BP 112/58 | HR 63 | Ht 71.0 in | Wt 183.2 lb

## 2019-12-03 DIAGNOSIS — R002 Palpitations: Secondary | ICD-10-CM

## 2019-12-03 DIAGNOSIS — R079 Chest pain, unspecified: Secondary | ICD-10-CM | POA: Diagnosis not present

## 2019-12-03 NOTE — Patient Instructions (Signed)
Medication Instructions:  The current medical regimen is effective;  continue present plan and medications.  *If you need a refill on your cardiac medications before your next appointment, please call your pharmacy*  Testing/Procedures: Your physician has requested that you have an echocardiogram. Echocardiography is a painless test that uses sound waves to create images of your heart. It provides your doctor with information about the size and shape of your heart and how well your heart's chambers and valves are working. This procedure takes approximately one hour. There are no restrictions for this procedure.  Your physician has requested that you have a lexiscan myoview. For further information please visit [email or web address]. Please follow instruction sheet, as given.   ZIO XT- Long Term Monitor Instructions   Your physician has requested you wear your ZIO patch monitor 14 days.   This is a single patch monitor.  [employer] supplies one patch monitor per enrollment.  Additional stickers are not available.   Please do not apply patch if you will be having a Nuclear Stress Test, Echocardiogram, Cardiac CT, MRI, or Chest Xray during the time frame you would be wearing the monitor. The patch cannot be worn during these tests.  You cannot remove and re-apply the ZIO XT patch monitor.   Your ZIO patch monitor will be sent USPS Priority mail from [facility] directly to your home address. The monitor may also be mailed to a PO BOX if home delivery is not available.   It may take 3-5 days to receive your monitor after you have been enrolled.   Once you have received you monitor, please review enclosed instructions.  Your monitor has already been registered assigning a specific monitor serial # to you.   Applying the monitor   Shave hair from upper left chest.   Hold abrader disc by orange tab.  Rub abrader in 40 strokes over left upper chest as indicated in your monitor instructions.   Clean area with 4 enclosed alcohol pads .  Use all pads to assure are is cleaned thoroughly.  Let dry.   Apply patch as indicated in monitor instructions.  Patch will be place under collarbone on left side of chest with arrow pointing upward.   Rub patch adhesive wings for 2 minutes.Remove white label marked "1".  Remove white label marked "2".  Rub patch adhesive wings for 2 additional minutes.   While looking in a mirror, press and release button in center of patch.  A small green light will flash 3-4 times .  This will be your only indicator the monitor has been turned on.     Do not shower for the first 24 hours.  You may shower after the first 24 hours.   Press button if you feel a symptom. You will hear a small click.  Record Date, Time and Symptom in the Patient Log Book.   When you are ready to remove patch, follow instructions on last 2 pages of Patient Log Book.  Stick patch monitor onto last page of Patient Log Book.   Place Patient Log Book in [place] box.  Use locking tab on box and tape box closed securely.  The [employer] has [employer] on it.  Please place in mailbox as soon as possible.  Your physician should have your test results approximately 7 days after the monitor has been mailed back to [facility].   Call [place] at [phone] if you have questions regarding your ZIO XT patch  monitor.  Call them immediately if you see an orange light blinking on your monitor.   If your monitor falls off in less than 4 days contact our Monitor department at [phone].  If your monitor becomes loose or falls off after 4 days call [employer] at [phone] for suggestions on securing your monitor.   Follow-Up: At [facility], you and your health needs are our priority.  As part of our continuing mission to provide you with exceptional heart care, we have created designated Provider Care Teams.  These Care Teams include your primary Cardiologist  (physician) and Advanced Practice Providers (APPs -  Physician Assistants and Nurse Practitioners) who all work together to provide you with the care you need, when you need it.  We recommend signing up for the patient portal called "MyChart".  Sign up information is provided on this After Visit Summary.  MyChart is used to connect with patients for Virtual Visits (Telemedicine).  Patients are able to view lab/test results, encounter notes, upcoming appointments, etc.  Non-urgent messages can be sent to your provider as well.   To learn more about what you can do with MyChart, go to [email or web address].    Your next appointment:   4-6 week(s)  The format for your next appointment:   In Person  Provider:   Candee Furbish, MD   Thank you for choosing [facility]!!

## 2019-12-03 NOTE — Progress Notes (Signed)
Cardiology Office Note:    Date:  12/03/2019   ID:  Mark Pruitt, DOB [birth year], MRN [id number]  PCP:  Eulas Post, MD  Cardiologist:  No primary care provider on file.  Electrophysiologist:  None   Referring MD: Eulas Post, MD     History of Present Illness:    Mark Pruitt is a 79 y.o. male here for the evaluation of chest pain at the request of Dr. Elease Hashimoto.  In review of Dr. Erick Blinks note he was having transient chest discomfort possible increased heart rate last Saturday evening.  Ate dinner and then 30 minutes later noticed diffuse chest pressure substernally lasting about 15 minutes.  Some discomfort when down the left arm.  No vomiting no diaphoresis.  No shortness of breath.  Oxygen levels were normal.  May be a heart rate as high as 180s on his pulse oximeter (tried on separate fingers, different hands and got same pulse) but trying to palpate the pulse and it was about 120 for about 2 hours.  He is pretty confident that he was feeling the rapid heart rates.    Drinks some coffee.  Denies any prior GERD-like symptoms.  Quit smoking in 1985 no diabetes.  Nonspecific fatigue the next day.,  Currently feeling back to normal.  In [place] he had an event monitor a few years ago which was unremarkable.  In 2012 he had an echocardiogram that showed mild mitral regurgitation and aortic regurgitation.  No prior stress test.  EKG performed on 11/30/2019 demonstrates sinus rhythm with occasional PACs and PVCs, nonspecific ST-T wave changes  No significant aortic atherosclerosis on CT scan 2016 personally reviewed.   Past Medical History:  Diagnosis Date  . Cancer [facility])    prostate cancer  . Cataract   . COLONIC POLYPS 07/25/2009  . ELEVATED PROSTATE SPECIFIC ANTIGEN 07/27/2009  . Hearing loss of both ears   . HYPERTROPHY PROSTATE W/UR OBST & OTH LUTS 07/25/2009  . Irregular heart beat   . LICHEN SIMPLEX CHRONICUS 02/03/2009  . Other primary  cardiomyopathies 08/08/2010    Past Surgical History:  Procedure Laterality Date  . COLONOSCOPY    . POLYPECTOMY    . PROSTATE SURGERY      Current Medications: Current Meds  Medication Sig  . aspirin EC 81 MG tablet Take 81 mg by mouth daily.  Marland Kitchen Bioflavonoid Products (ESTER-C) TABS Take 1 tablet by mouth daily.  Marland Kitchen lisinopril (ZESTRIL) 5 MG tablet TAKE 1 TABLET BY MOUTH  DAILY  . Multiple Vitamin (MULTIVITAMIN WITH MINERALS) TABS tablet Take 1 tablet by mouth daily.  . nabumetone (RELAFEN) 750 MG tablet Take 1 tablet (750 mg total) by mouth 2 (two) times daily as needed.  . Omega-3 Fatty Acids (FISH OIL) 1000 MG CAPS Take by mouth. Take one tablet once per day.  . triamcinolone cream (KENALOG) 0.1 % Apply 1 application topically 2 (two) times daily as needed (for itching/irritation).   Current Facility-Administered Medications for the 12/03/19 encounter (Office Visit) with Jerline Pain, MD  Medication  . methylPREDNISolone acetate (DEPO-MEDROL) injection 40 mg     Allergies:   Patient has no known allergies.   Social History   Socioeconomic History  . Marital status: Married    Spouse name: Not on file  . Number of children: Not on file  . Years of education: Not on file  . Highest education level: Not on file  Occupational History  . Not on file  Tobacco Use  .  Smoking status: Former Smoker    Packs/day: 1.00    Years: 20.00    Pack years: 20.00    Types: Cigarettes    Quit date: 10/22/1983    Years since quitting: 36.1  . Smokeless tobacco: Never Used  Substance and Sexual Activity  . Alcohol use: No    Alcohol/week: 0.0 standard drinks  . Drug use: No  . Sexual activity: Not on file  Other Topics Concern  . Not on file  Social History Narrative   From [place] originally    Social Determinants of Health   Financial Resource Strain:   . Difficulty of Paying Living Expenses: Not on file  Food Insecurity:   . Worried About Charity fundraiser in the Last  Year: Not on file  . Ran Out of Food in the Last Year: Not on file  Transportation Needs:   . Lack of Transportation (Medical): Not on file  . Lack of Transportation (Non-Medical): Not on file  Physical Activity:   . Days of Exercise per Week: Not on file  . Minutes of Exercise per Session: Not on file  Stress:   . Feeling of Stress : Not on file  Social Connections:   . Frequency of Communication with Friends and Family: Not on file  . Frequency of Social Gatherings with Friends and Family: Not on file  . Attends Religious Services: Not on file  . Active Member of Clubs or Organizations: Not on file  . Attends Archivist Meetings: Not on file  . Marital Status: Not on file     Family History: The patient's family history includes Anuerysm (age of onset: 13) in his father; Cancer in his mother. There is no history of Arthritis, Colon cancer, Esophageal cancer, Rectal cancer, or Stomach cancer.  ROS:   Please see the history of present illness.    No fevers chills nausea vomiting syncope bleeding all other systems reviewed and are negative.  EKGs/Labs/Other Studies Reviewed:    The following studies were reviewed today: Prior EKG, CT of abdomen pelvis reviewed prior echocardiogram reviewed.  EKG: 11/30/2019-sinus rhythm with PACs, PVC noted.  Prior EKG in 2017 shows sinus rhythm with PACs  Recent Labs: 05/18/2019: ALT 13; BUN 20; Creatinine, Ser 1.08; Hemoglobin 15.2; Platelets 155.0; Potassium 4.8; Sodium 140; TSH 1.84  Recent Lipid Panel    Component Value Date/Time   CHOL 186 05/18/2019 1050   TRIG 82.0 05/18/2019 1050   HDL 64.30 05/18/2019 1050   CHOLHDL 3 05/18/2019 1050   VLDL 16.4 05/18/2019 1050   LDLCALC 105 (H) 05/18/2019 1050    Physical Exam:    VS:  BP (!) 112/58   Pulse 63   Ht 5\' 11"  (1.803 m)   Wt 183 lb 3.2 oz (83.1 kg)   SpO2 96%   BMI 25.55 kg/m     Wt Readings from Last 3 Encounters:  12/03/19 183 lb 3.2 oz (83.1 kg)  11/30/19 183  lb (83 kg)  10/30/19 182 lb 3.2 oz (82.6 kg)     GEN:  Well nourished, well developed in no acute distress HEENT: Normal NECK: No JVD; No carotid bruits LYMPHATICS: No lymphadenopathy CARDIAC: RRR, no murmurs, rubs, gallops RESPIRATORY:  Clear to auscultation without rales, wheezing or rhonchi  ABDOMEN: Soft, non-tender, non-distended MUSCULOSKELETAL:  No edema; No deformity  SKIN: Warm and dry NEUROLOGIC:  Alert and oriented x 3 PSYCHIATRIC:  Normal affect   ASSESSMENT:    1. Palpitations   2.  Chest pain, unspecified type    PLAN:    In order of problems listed above:  Chest discomfort in the setting of tachy arrhythmia -We will go ahead and check an echocardiogram to ensure proper structure and function of his heart. -We will check a Zio patch monitor for 14 days.  I do hear quite a bit of ectopy on exam auscultation.  His EKG most recently does show PVCs and PACs.  I wonder if he may have had an episode of rapid atrial fibrillation perhaps triggering the discomfort, i.e. demand ischemia type process. -Normally when walking up a hill he does not feel any difficulty. -I will check a Lexiscan stress test to ensure he is not have any degree of ischemia or infarct present.  Back in [place] in the 1990s he did have a stress test that showed a potential defect inferiorly, subsequent heart catheterization was normal. -If the symptoms do return, I do encourage him to call EMS for further evaluation in the emergency room.  Continue with aspirin.  Essential hypertension -Excellent on lisinopril.    Medication Adjustments/Labs and Tests Ordered: Current medicines are reviewed at length with the patient today.  Concerns regarding medicines are outlined above.  Orders Placed This Encounter  Procedures  . LONG TERM MONITOR (3-14 DAYS)  . MYOCARDIAL PERFUSION IMAGING  . ECHOCARDIOGRAM COMPLETE   No orders of the defined types were placed in this encounter.   Patient Instructions   Medication Instructions:  The current medical regimen is effective;  continue present plan and medications.  *If you need a refill on your cardiac medications before your next appointment, please call your pharmacy*  Testing/Procedures: Your physician has requested that you have an echocardiogram. Echocardiography is a painless test that uses sound waves to create images of your heart. It provides your doctor with information about the size and shape of your heart and how well your heart's chambers and valves are working. This procedure takes approximately one hour. There are no restrictions for this procedure.  Your physician has requested that you have a lexiscan myoview. For further information please visit [email or web address]. Please follow instruction sheet, as given.   ZIO XT- Long Term Monitor Instructions   Your physician has requested you wear your ZIO patch monitor 14 days.   This is a single patch monitor.  [employer] supplies one patch monitor per enrollment.  Additional stickers are not available.   Please do not apply patch if you will be having a Nuclear Stress Test, Echocardiogram, Cardiac CT, MRI, or Chest Xray during the time frame you would be wearing the monitor. The patch cannot be worn during these tests.  You cannot remove and re-apply the ZIO XT patch monitor.   Your ZIO patch monitor will be sent USPS Priority mail from [facility] directly to your home address. The monitor may also be mailed to a PO BOX if home delivery is not available.   It may take 3-5 days to receive your monitor after you have been enrolled.   Once you have received you monitor, please review enclosed instructions.  Your monitor has already been registered assigning a specific monitor serial # to you.   Applying the monitor   Shave hair from upper left chest.   Hold abrader disc by orange tab.  Rub abrader in 40 strokes over left upper chest as indicated in your monitor instructions.    Clean area with 4 enclosed alcohol pads .  Use all pads to assure are is  cleaned thoroughly.  Let dry.   Apply patch as indicated in monitor instructions.  Patch will be place under collarbone on left side of chest with arrow pointing upward.   Rub patch adhesive wings for 2 minutes.Remove white label marked "1".  Remove white label marked "2".  Rub patch adhesive wings for 2 additional minutes.   While looking in a mirror, press and release button in center of patch.  A small green light will flash 3-4 times .  This will be your only indicator the monitor has been turned on.     Do not shower for the first 24 hours.  You may shower after the first 24 hours.   Press button if you feel a symptom. You will hear a small click.  Record Date, Time and Symptom in the Patient Log Book.   When you are ready to remove patch, follow instructions on last 2 pages of Patient Log Book.  Stick patch monitor onto last page of Patient Log Book.   Place Patient Log Book in [place] box.  Use locking tab on box and tape box closed securely.  The [employer] has [employer] on it.  Please place in mailbox as soon as possible.  Your physician should have your test results approximately 7 days after the monitor has been mailed back to [facility].   Call [place] at [phone] if you have questions regarding your ZIO XT patch monitor.  Call them immediately if you see an orange light blinking on your monitor.   If your monitor falls off in less than 4 days contact our Monitor department at [phone].  If your monitor becomes loose or falls off after 4 days call [employer] at [phone] for suggestions on securing your monitor.   Follow-Up: At [facility], you and your health needs are our priority.  As part of our continuing mission to provide you with exceptional heart care, we have created designated Provider Care Teams.  These Care Teams include your primary Cardiologist  (physician) and Advanced Practice Providers (APPs -  Physician Assistants and Nurse Practitioners) who all work together to provide you with the care you need, when you need it.  We recommend signing up for the patient portal called "MyChart".  Sign up information is provided on this After Visit Summary.  MyChart is used to connect with patients for Virtual Visits (Telemedicine).  Patients are able to view lab/test results, encounter notes, upcoming appointments, etc.  Non-urgent messages can be sent to your provider as well.   To learn more about what you can do with MyChart, go to [email or web address].    Your next appointment:   4-6 week(s)  The format for your next appointment:   In Person  Provider:   Candee Furbish, MD   Thank you for choosing [facility]!!        Signed, Candee Furbish, MD  12/03/2019 3:53 PM    [place]

## 2019-12-03 NOTE — Telephone Encounter (Signed)
Enrolled patient for a 14 day Zio monitor to be mailed to patients home.  

## 2019-12-09 ENCOUNTER — Other Ambulatory Visit (INDEPENDENT_AMBULATORY_CARE_PROVIDER_SITE_OTHER): Payer: Medicare Other

## 2019-12-09 DIAGNOSIS — R002 Palpitations: Secondary | ICD-10-CM

## 2019-12-09 DIAGNOSIS — R079 Chest pain, unspecified: Secondary | ICD-10-CM | POA: Diagnosis not present

## 2019-12-22 ENCOUNTER — Telehealth (HOSPITAL_COMMUNITY): Payer: Self-pay

## 2019-12-22 NOTE — Telephone Encounter (Signed)
Spoke with the patient and his wife at the same time giving detailed instructions for his test. They stated that he would would be here for his test. Asked to call back with any questions. S.Tobby Fawcett EMTP

## 2019-12-29 ENCOUNTER — Other Ambulatory Visit: Payer: Self-pay

## 2019-12-29 ENCOUNTER — Ambulatory Visit (HOSPITAL_BASED_OUTPATIENT_CLINIC_OR_DEPARTMENT_OTHER): Payer: Medicare Other

## 2019-12-29 ENCOUNTER — Ambulatory Visit (HOSPITAL_COMMUNITY): Payer: Medicare Other | Attending: Cardiology

## 2019-12-29 DIAGNOSIS — R079 Chest pain, unspecified: Secondary | ICD-10-CM | POA: Diagnosis present

## 2019-12-29 DIAGNOSIS — R002 Palpitations: Secondary | ICD-10-CM

## 2019-12-29 LAB — MYOCARDIAL PERFUSION IMAGING
LV dias vol: 150 mL (ref 62–150)
LV sys vol: 100 mL
Peak HR: 98 {beats}/min
Rest HR: 63 {beats}/min
SDS: 0
SRS: 0
SSS: 0
TID: 0.97

## 2019-12-29 LAB — ECHOCARDIOGRAM COMPLETE
Height: 71 in
Weight: 2928 [oz_av]

## 2019-12-29 MED ORDER — TECHNETIUM TC 99M TETROFOSMIN IV KIT
10.4000 | PACK | Freq: Once | INTRAVENOUS | Status: AC | PRN
  Administered 2019-12-29: 10.4 via INTRAVENOUS
  Filled 2019-12-29: qty 11

## 2019-12-29 MED ORDER — TECHNETIUM TC 99M TETROFOSMIN IV KIT
31.6000 | PACK | Freq: Once | INTRAVENOUS | Status: AC | PRN
  Administered 2019-12-29: 31.6 via INTRAVENOUS
  Filled 2019-12-29: qty 32

## 2019-12-29 MED ORDER — REGADENOSON 0.4 MG/5ML IV SOLN
0.4000 mg | Freq: Once | INTRAVENOUS | Status: AC
  Administered 2019-12-29: 0.4 mg via INTRAVENOUS

## 2019-12-30 ENCOUNTER — Telehealth: Payer: Self-pay | Admitting: Cardiology

## 2019-12-30 NOTE — Telephone Encounter (Signed)
Patient returned a call from our office to discuss his echo results. Call was transferred to [place]

## 2020-01-02 DIAGNOSIS — R002 Palpitations: Secondary | ICD-10-CM | POA: Diagnosis not present

## 2020-01-06 ENCOUNTER — Other Ambulatory Visit: Payer: Self-pay

## 2020-01-06 ENCOUNTER — Ambulatory Visit: Payer: Medicare Other | Admitting: Cardiology

## 2020-01-06 ENCOUNTER — Encounter: Payer: Self-pay | Admitting: Cardiology

## 2020-01-06 VITALS — BP 104/60 | HR 76 | Ht 71.0 in | Wt 185.4 lb

## 2020-01-06 DIAGNOSIS — I420 Dilated cardiomyopathy: Secondary | ICD-10-CM

## 2020-01-06 DIAGNOSIS — I472 Ventricular tachycardia, unspecified: Secondary | ICD-10-CM

## 2020-01-06 DIAGNOSIS — R079 Chest pain, unspecified: Secondary | ICD-10-CM | POA: Diagnosis not present

## 2020-01-06 LAB — CBC
Hematocrit: 44.5 % (ref 37.5–51.0)
Hemoglobin: 15 g/dL (ref 13.0–17.7)
MCH: 30.4 pg (ref 26.6–33.0)
MCHC: 33.7 g/dL (ref 31.5–35.7)
MCV: 90 fL (ref 79–97)
Platelets: 185 x10E3/uL (ref 150–450)
RBC: 4.94 x10E6/uL (ref 4.14–5.80)
RDW: 14 % (ref 11.6–15.4)
WBC: 7 x10E3/uL (ref 3.4–10.8)

## 2020-01-06 LAB — BASIC METABOLIC PANEL WITH GFR
BUN/Creatinine Ratio: 18 (ref 10–24)
BUN: 17 mg/dL (ref 8–27)
CO2: 27 mmol/L (ref 20–29)
Calcium: 9.6 mg/dL (ref 8.6–10.2)
Chloride: 104 mmol/L (ref 96–106)
Creatinine, Ser: 0.94 mg/dL (ref 0.76–1.27)
GFR calc Af Amer: 88 mL/min/1.73 (ref >59)
GFR calc non Af Amer: 76 mL/min/1.73 (ref >59)
Glucose: 85 mg/dL (ref 65–99)
Potassium: 4.6 mmol/L (ref 3.5–5.2)
Sodium: 137 mmol/L (ref 134–144)

## 2020-01-06 MED ORDER — METOPROLOL SUCCINATE ER 25 MG PO TB24: 25.0000 mg | ORAL_TABLET | Freq: Every day | ORAL | 3 refills | Status: DC

## 2020-01-06 NOTE — Patient Instructions (Signed)
Medication Instructions:  Your physician has recommended you make the following change in your medication:  1) START taking Toprol 25 mg daily  *If you need a refill on your cardiac medications before your next appointment, please call your pharmacy*   Lab Work: BMET and CBC If you have labs (blood work) drawn today and your tests are completely normal, you will receive your results only by: Marland Kitchen MyChart Message (if you have MyChart) OR . A paper copy in the mail If you have any lab test that is abnormal or we need to change your treatment, we will call you to review the results.   Testing/Procedures: Your physician has requested that you have a cardiac catheterization. Cardiac catheterization is used to diagnose and/or treat various heart conditions. Doctors may recommend this procedure for a number of different reasons. The most common reason is to evaluate chest pain. Chest pain can be a symptom of coronary artery disease (CAD), and cardiac catheterization can show whether plaque is narrowing or blocking your heart's arteries. This procedure is also used to evaluate the valves, as well as measure the blood flow and oxygen levels in different parts of your heart. For further information please visit [email or web address]. Please follow instruction sheet, as given.   Follow-Up: At [facility], you and your health needs are our priority.  As part of our continuing mission to provide you with exceptional heart care, we have created designated Provider Care Teams.  These Care Teams include your primary Cardiologist (physician) and Advanced Practice Providers (APPs -  Physician Assistants and Nurse Practitioners) who all work together to provide you with the care you need, when you need it.   Other Instructions    [place] OFFICE [place], [place] [address] Dept: [phone] Loc:  [place]  01/06/2020  You are scheduled for a Cardiac Catheterization on Wednesday, April 14 with Dr. [name].  1. Please arrive at the [facility] (Main Entrance A) at [facility] [address] [place], [place] [address] at 8:00 AM (This time is two hours before your procedure to ensure your preparation). Free valet parking service is available.   Special note: Every effort is made to have your procedure done on time. Please understand that emergencies sometimes delay scheduled procedures.  2. Diet: Do not eat solid foods after midnight.  The patient may have clear liquids until 5am upon the day of the procedure.  3. Labs: You will need to have blood drawn on Wednesday, April 7 at [facility] at [facility]. [address]  Open: 7:30am - 5pm    Phone: [phone]. You do not need to be fasting.  4. Medication instructions in preparation for your procedure:   Contrast Allergy: No  On the morning of your procedure, take your Aspirin and any morning medicines NOT listed above.  You may use sips of water.  5. Plan for one night stay--bring personal belongings. 6. Bring a current list of your medications and current insurance cards. 7. You MUST have a responsible person to drive you home. 8. Someone MUST be with you the first 24 hours after you arrive home or your discharge will be delayed. 9. Please wear clothes that are easy to get on and off and wear slip-on shoes.  Thank you for allowing [place] to care for you!   -- [place] [facility]

## 2020-01-06 NOTE — H&P (View-Only) (Signed)
Cardiology Office Note:    Date:  01/06/2020   ID:  Mark Pruitt, DOB 08/10/1941, MRN [id number]  PCP:  Eulas Post, MD  Cardiologist:  Candee Furbish, MD  Electrophysiologist:  None   Referring MD: Eulas Post, MD     History of Present Illness:    Mark Pruitt is a 79 y.o. male here for follow up ECHO NUC stress and monitor with prior chest pain.   Prior note: Ate dinner and then 30 minutes later noticed diffuse chest pressure substernally lasting about 15 minutes.  Some discomfort when down the left arm.  No vomiting no diaphoresis.  No shortness of breath.  Oxygen levels were normal.  May be a heart rate as high as 180s on his pulse oximeter (tried on separate fingers, different hands and got same pulse) but trying to palpate the pulse and it was about 120 for about 2 hours.  He is pretty confident that he was feeling the rapid heart rates.    EKG performed on 11/30/2019 demonstrates sinus rhythm with occasional PACs and PVCs, nonspecific ST-T wave changes No significant aortic atherosclerosis on CT scan 2016 personally reviewed.   ECHO 12/29/19:  1. Left ventricular ejection fraction, by estimation, is 40 to 45%. The  left ventricle has mildly decreased function. The left ventricle  demonstrates global hypokinesis, most pronounced at base with improvement  in function towards apex. The left  ventricular internal cavity size was mildly dilated. There is mild left  ventricular hypertrophy. Left ventricular diastolic function could not be  evaluated, as tissue Doppler was not done.  2. Right ventricular systolic function is normal. The right ventricular  size is normal. There is normal pulmonary artery systolic pressure. The  estimated right ventricular systolic pressure is Q000111Q mmHg.  3. The mitral valve is normal in structure. Mild mitral valve  regurgitation.  4. The aortic valve is tricuspid. Aortic valve regurgitation is trivial.  No aortic  stenosis is present.  5. Aortic dilatation noted. There is mild dilatation of the ascending  aorta measuring 37 mm.  6. The inferior vena cava is normal in size with greater than 50%  respiratory variability, suggesting right atrial pressure of 3 mmHg.   NUC stress 12/29/19:   Nuclear stress EF: 33%.  There was no ST segment deviation noted during stress.  Defect 1: There is a defect present in the basal inferior, mid inferior, apical inferior and apex location.  This is a high risk study.  The left ventricular ejection fraction is moderately decreased (30-44%).  No prior study for comparison.   There is a diffuse perfusion defect at rest across the inferior wall to the apex. This largely improves on stress imaging, with some residual basal inferior perfusion defect on stress imaging. This is not suggestive of ischemia, as it improves with stress. May be artifact on rest images due to diaphragmatic attenuation vs. Hibernating myocardium. There is diffuse global hypokinesis. EF calculated at 33%, which makes this a high risk study despite clear ischemia.  Event monitor 01/04/20:  Sinus rhythm with frequent isolated PVC's (12%)  Rare episodes of slow ventricular tachycardia (6-8 beats with avg of 127 bpm)  Brief paroxysmal atrial tachycardia episodes  No atrial fibrillation, no pauses  Will discuss metoprolol with him at follow up visit   Past Medical History:  Diagnosis Date  . Cancer [facility])    prostate cancer  . Cataract   . COLONIC POLYPS 07/25/2009  . ELEVATED PROSTATE SPECIFIC ANTIGEN  07/27/2009  . Hearing loss of both ears   . HYPERTROPHY PROSTATE W/UR OBST & OTH LUTS 07/25/2009  . Irregular heart beat   . LICHEN SIMPLEX CHRONICUS 02/03/2009  . Other primary cardiomyopathies 08/08/2010    Past Surgical History:  Procedure Laterality Date  . COLONOSCOPY    . POLYPECTOMY    . PROSTATE SURGERY      Current Medications: Current Meds  Medication Sig  . aspirin  EC 81 MG tablet Take 81 mg by mouth daily.  Marland Kitchen Bioflavonoid Products (ESTER-C) TABS Take 1 tablet by mouth daily.  Marland Kitchen lisinopril (ZESTRIL) 5 MG tablet TAKE 1 TABLET BY MOUTH  DAILY  . Multiple Vitamin (MULTIVITAMIN WITH MINERALS) TABS tablet Take 1 tablet by mouth daily.  . nabumetone (RELAFEN) 750 MG tablet Take 1 tablet (750 mg total) by mouth 2 (two) times daily as needed.  . Omega-3 Fatty Acids (FISH OIL) 1000 MG CAPS Take by mouth. Take one tablet once per day.  . triamcinolone cream (KENALOG) 0.1 % Apply 1 application topically 2 (two) times daily as needed (for itching/irritation).   Current Facility-Administered Medications for the 01/06/20 encounter (Office Visit) with Jerline Pain, MD  Medication  . methylPREDNISolone acetate (DEPO-MEDROL) injection 40 mg     Allergies:   Patient has no known allergies.   Social History   Socioeconomic History  . Marital status: Married    Spouse name: Not on file  . Number of children: Not on file  . Years of education: Not on file  . Highest education level: Not on file  Occupational History  . Not on file  Tobacco Use  . Smoking status: Former Smoker    Packs/day: 1.00    Years: 20.00    Pack years: 20.00    Types: Cigarettes    Quit date: 10/22/1983    Years since quitting: 36.2  . Smokeless tobacco: Never Used  Substance and Sexual Activity  . Alcohol use: No    Alcohol/week: 0.0 standard drinks  . Drug use: No  . Sexual activity: Not on file  Other Topics Concern  . Not on file  Social History Narrative   From [place] originally    Social Determinants of Health   Financial Resource Strain:   . Difficulty of Paying Living Expenses:   Food Insecurity:   . Worried About Charity fundraiser in the Last Year:   . Arboriculturist in the Last Year:   Transportation Needs:   . Film/video editor (Medical):   Marland Kitchen Lack of Transportation (Non-Medical):   Physical Activity:   . Days of Exercise per Week:   . Minutes of  Exercise per Session:   Stress:   . Feeling of Stress :   Social Connections:   . Frequency of Communication with Friends and Family:   . Frequency of Social Gatherings with Friends and Family:   . Attends Religious Services:   . Active Member of Clubs or Organizations:   . Attends Archivist Meetings:   Marland Kitchen Marital Status:      Family History: The patient's family history includes Anuerysm (age of onset: 69) in his father; Cancer in his mother. There is no history of Arthritis, Colon cancer, Esophageal cancer, Rectal cancer, or Stomach cancer.  ROS:   Please see the history of present illness.    No fevers chills nausea vomiting syncope bleeding all other systems reviewed and are negative.  EKGs/Labs/Other Studies Reviewed:    The  following studies were reviewed today: Echocardiogram nuclear stress monitor  EKG:  EKG is not ordered today.   Recent Labs: 05/18/2019: ALT 13; BUN 20; Creatinine, Ser 1.08; Hemoglobin 15.2; Platelets 155.0; Potassium 4.8; Sodium 140; TSH 1.84  Recent Lipid Panel    Component Value Date/Time   CHOL 186 05/18/2019 1050   TRIG 82.0 05/18/2019 1050   HDL 64.30 05/18/2019 1050   CHOLHDL 3 05/18/2019 1050   VLDL 16.4 05/18/2019 1050   LDLCALC 105 (H) 05/18/2019 1050    Physical Exam:    VS:  BP 104/60   Pulse 76   Ht 5\' 11"  (1.803 m)   Wt 185 lb 6.4 oz (84.1 kg)   SpO2 98%   BMI 25.86 kg/m     Wt Readings from Last 3 Encounters:  01/06/20 185 lb 6.4 oz (84.1 kg)  12/29/19 183 lb (83 kg)  12/03/19 183 lb 3.2 oz (83.1 kg)     GEN:  Well nourished, well developed in no acute distress HEENT: Normal NECK: No JVD; No carotid bruits LYMPHATICS: No lymphadenopathy CARDIAC: RRR, ectopy, no murmurs, rubs, gallops RESPIRATORY:  Clear to auscultation without rales, wheezing or rhonchi  ABDOMEN: Soft, non-tender, non-distended MUSCULOSKELETAL:  No edema; No deformity  Excellent radial pulse.  SKIN: Warm and dry NEUROLOGIC:  Alert and  oriented x 3 PSYCHIATRIC:  Normal affect.   ASSESSMENT:    1. Dilated cardiomyopathy ([place])   2. Ventricular tachycardia (HCC)   3. Chest pain, unspecified type    PLAN:    In order of problems listed above:  Chest pain, slow VT, reduced EF, cardiomyopathy  --  In [place] in the 1990s he did have a stress test that showed a potential defect inferiorly, subsequent heart catheterization was normal. -- I think it makes sense to repeat cath since it has been several years and may have changed. -- Start Toprol 25mg  PO QD, continue with low-dose lisinopril 5 mg a day.  Prior creatinine 1.08 hemoglobin 15.2 --PVCs are approximately 12%.  They could be contributing to his reduced EF.  If Toprol does not seem to be helping, we can always refer to electrophysiology for potential further intensive therapy. --Risk and benefits have been explained including stroke heart attack death renal impairment bleeding.  He is willing to proceed.  Spoke with his wife also on the phone.  Time spent: 41 minutes reviewing data, personally reviewing reports, monitor, stress test, echocardiogram and demonstrating this to the patient at the office, discussion on phone with his wife, documentation.   Medication Adjustments/Labs and Tests Ordered: Current medicines are reviewed at length with the patient today.  Concerns regarding medicines are outlined above.  No orders of the defined types were placed in this encounter.  Meds ordered this encounter  Medications  . metoprolol succinate (TOPROL XL) 25 MG 24 hr tablet    Sig: Take 1 tablet (25 mg total) by mouth daily.    Dispense:  90 tablet    Refill:  3    There are no Patient Instructions on file for this visit.   Signed, Candee Furbish, MD  01/06/2020 9:56 AM    [facility]

## 2020-01-06 NOTE — Progress Notes (Signed)
Cardiology Office Note:    Date:  01/06/2020   ID:  Mark Pruitt, DOB [birth year], MRN [id number]  PCP:  Mark Post, MD  Cardiologist:  Mark Furbish, MD  Electrophysiologist:  None   Referring MD: Mark Post, MD     History of Present Illness:    Mark Pruitt is a 79 y.o. male here for follow up ECHO NUC stress and monitor with prior chest pain.   Prior note: Ate dinner and then 30 minutes later noticed diffuse chest pressure substernally lasting about 15 minutes.  Some discomfort when down the left arm.  No vomiting no diaphoresis.  No shortness of breath.  Oxygen levels were normal.  May be a heart rate as high as 180s on his pulse oximeter (tried on separate fingers, different hands and got same pulse) but trying to palpate the pulse and it was about 120 for about 2 hours.  He is pretty confident that he was feeling the rapid heart rates.    EKG performed on 11/30/2019 demonstrates sinus rhythm with occasional PACs and PVCs, nonspecific ST-T wave changes No significant aortic atherosclerosis on CT scan 2016 personally reviewed.   ECHO 12/29/19:  1. Left ventricular ejection fraction, by estimation, is 40 to 45%. The  left ventricle has mildly decreased function. The left ventricle  demonstrates global hypokinesis, most pronounced at base with improvement  in function towards apex. The left  ventricular internal cavity size was mildly dilated. There is mild left  ventricular hypertrophy. Left ventricular diastolic function could not be  evaluated, as tissue Doppler was not done.  2. Right ventricular systolic function is normal. The right ventricular  size is normal. There is normal pulmonary artery systolic pressure. The  estimated right ventricular systolic pressure is Q000111Q mmHg.  3. The mitral valve is normal in structure. Mild mitral valve  regurgitation.  4. The aortic valve is tricuspid. Aortic valve regurgitation is trivial.  No aortic  stenosis is present.  5. Aortic dilatation noted. There is mild dilatation of the ascending  aorta measuring 37 mm.  6. The inferior vena cava is normal in size with greater than 50%  respiratory variability, suggesting right atrial pressure of 3 mmHg.   NUC stress 12/29/19:   Nuclear stress EF: 33%.  There was no ST segment deviation noted during stress.  Defect 1: There is a defect present in the basal inferior, mid inferior, apical inferior and apex location.  This is a high risk study.  The left ventricular ejection fraction is moderately decreased (30-44%).  No prior study for comparison.   There is a diffuse perfusion defect at rest across the inferior wall to the apex. This largely improves on stress imaging, with some residual basal inferior perfusion defect on stress imaging. This is not suggestive of ischemia, as it improves with stress. May be artifact on rest images due to diaphragmatic attenuation vs. Hibernating myocardium. There is diffuse global hypokinesis. EF calculated at 33%, which makes this a high risk study despite clear ischemia.  Event monitor 01/04/20:  Sinus rhythm with frequent isolated PVC's (12%)  Rare episodes of slow ventricular tachycardia (6-8 beats with avg of 127 bpm)  Brief paroxysmal atrial tachycardia episodes  No atrial fibrillation, no pauses  Will discuss metoprolol with him at follow up visit   Past Medical History:  Diagnosis Date  . Cancer [facility])    prostate cancer  . Cataract   . COLONIC POLYPS 07/25/2009  . ELEVATED PROSTATE SPECIFIC ANTIGEN  07/27/2009  . Hearing loss of both ears   . HYPERTROPHY PROSTATE W/UR OBST & OTH LUTS 07/25/2009  . Irregular heart beat   . LICHEN SIMPLEX CHRONICUS 02/03/2009  . Other primary cardiomyopathies 08/08/2010    Past Surgical History:  Procedure Laterality Date  . COLONOSCOPY    . POLYPECTOMY    . PROSTATE SURGERY      Current Medications: Current Meds  Medication Sig  . aspirin  EC 81 MG tablet Take 81 mg by mouth daily.  Marland Kitchen Bioflavonoid Products (ESTER-C) TABS Take 1 tablet by mouth daily.  Marland Kitchen lisinopril (ZESTRIL) 5 MG tablet TAKE 1 TABLET BY MOUTH  DAILY  . Multiple Vitamin (MULTIVITAMIN WITH MINERALS) TABS tablet Take 1 tablet by mouth daily.  . nabumetone (RELAFEN) 750 MG tablet Take 1 tablet (750 mg total) by mouth 2 (two) times daily as needed.  . Omega-3 Fatty Acids (FISH OIL) 1000 MG CAPS Take by mouth. Take one tablet once per day.  . triamcinolone cream (KENALOG) 0.1 % Apply 1 application topically 2 (two) times daily as needed (for itching/irritation).   Current Facility-Administered Medications for the 01/06/20 encounter (Office Visit) with Jerline Pain, MD  Medication  . methylPREDNISolone acetate (DEPO-MEDROL) injection 40 mg     Allergies:   Patient has no known allergies.   Social History   Socioeconomic History  . Marital status: Married    Spouse name: Not on file  . Number of children: Not on file  . Years of education: Not on file  . Highest education level: Not on file  Occupational History  . Not on file  Tobacco Use  . Smoking status: Former Smoker    Packs/day: 1.00    Years: 20.00    Pack years: 20.00    Types: Cigarettes    Quit date: 10/22/1983    Years since quitting: 36.2  . Smokeless tobacco: Never Used  Substance and Sexual Activity  . Alcohol use: No    Alcohol/week: 0.0 standard drinks  . Drug use: No  . Sexual activity: Not on file  Other Topics Concern  . Not on file  Social History Narrative   From [place] originally    Social Determinants of Health   Financial Resource Strain:   . Difficulty of Paying Living Expenses:   Food Insecurity:   . Worried About Charity fundraiser in the Last Year:   . Arboriculturist in the Last Year:   Transportation Needs:   . Film/video editor (Medical):   Marland Kitchen Lack of Transportation (Non-Medical):   Physical Activity:   . Days of Exercise per Week:   . Minutes of  Exercise per Session:   Stress:   . Feeling of Stress :   Social Connections:   . Frequency of Communication with Friends and Family:   . Frequency of Social Gatherings with Friends and Family:   . Attends Religious Services:   . Active Member of Clubs or Organizations:   . Attends Archivist Meetings:   Marland Kitchen Marital Status:      Family History: The patient's family history includes Anuerysm (age of onset: 64) in his father; Cancer in his mother. There is no history of Arthritis, Colon cancer, Esophageal cancer, Rectal cancer, or Stomach cancer.  ROS:   Please see the history of present illness.    No fevers chills nausea vomiting syncope bleeding all other systems reviewed and are negative.  EKGs/Labs/Other Studies Reviewed:    The  following studies were reviewed today: Echocardiogram nuclear stress monitor  EKG:  EKG is not ordered today.   Recent Labs: 05/18/2019: ALT 13; BUN 20; Creatinine, Ser 1.08; Hemoglobin 15.2; Platelets 155.0; Potassium 4.8; Sodium 140; TSH 1.84  Recent Lipid Panel    Component Value Date/Time   CHOL 186 05/18/2019 1050   TRIG 82.0 05/18/2019 1050   HDL 64.30 05/18/2019 1050   CHOLHDL 3 05/18/2019 1050   VLDL 16.4 05/18/2019 1050   LDLCALC 105 (H) 05/18/2019 1050    Physical Exam:    VS:  BP 104/60   Pulse 76   Ht 5\' 11"  (1.803 m)   Wt 185 lb 6.4 oz (84.1 kg)   SpO2 98%   BMI 25.86 kg/m     Wt Readings from Last 3 Encounters:  01/06/20 185 lb 6.4 oz (84.1 kg)  12/29/19 183 lb (83 kg)  12/03/19 183 lb 3.2 oz (83.1 kg)     GEN:  Well nourished, well developed in no acute distress HEENT: Normal NECK: No JVD; No carotid bruits LYMPHATICS: No lymphadenopathy CARDIAC: RRR, ectopy, no murmurs, rubs, gallops RESPIRATORY:  Clear to auscultation without rales, wheezing or rhonchi  ABDOMEN: Soft, non-tender, non-distended MUSCULOSKELETAL:  No edema; No deformity  Excellent radial pulse.  SKIN: Warm and dry NEUROLOGIC:  Alert and  oriented x 3 PSYCHIATRIC:  Normal affect.   ASSESSMENT:    1. Dilated cardiomyopathy ([place])   2. Ventricular tachycardia (HCC)   3. Chest pain, unspecified type    PLAN:    In order of problems listed above:  Chest pain, slow VT, reduced EF, cardiomyopathy  --  In [place] in the 1990s he did have a stress test that showed a potential defect inferiorly, subsequent heart catheterization was normal. -- I think it makes sense to repeat cath since it has been several years and may have changed. -- Start Toprol 25mg  PO QD, continue with low-dose lisinopril 5 mg a day.  Prior creatinine 1.08 hemoglobin 15.2 --PVCs are approximately 12%.  They could be contributing to his reduced EF.  If Toprol does not seem to be helping, we can always refer to electrophysiology for potential further intensive therapy. --Risk and benefits have been explained including stroke heart attack death renal impairment bleeding.  He is willing to proceed.  Spoke with his wife also on the phone.  Time spent: 41 minutes reviewing data, personally reviewing reports, monitor, stress test, echocardiogram and demonstrating this to the patient at the office, discussion on phone with his wife, documentation.   Medication Adjustments/Labs and Tests Ordered: Current medicines are reviewed at length with the patient today.  Concerns regarding medicines are outlined above.  No orders of the defined types were placed in this encounter.  Meds ordered this encounter  Medications  . metoprolol succinate (TOPROL XL) 25 MG 24 hr tablet    Sig: Take 1 tablet (25 mg total) by mouth daily.    Dispense:  90 tablet    Refill:  3    There are no Patient Instructions on file for this visit.   Signed, Mark Furbish, MD  01/06/2020 9:56 AM    [facility]

## 2020-01-07 MED ORDER — SODIUM CHLORIDE 0.9% FLUSH: 3.0000 mL | Freq: Two times a day (BID) | INTRAVENOUS | Status: DC

## 2020-01-07 NOTE — Addendum Note (Signed)
Addended by: Jerline Pain on: 01/07/2020 06:58 PM   Modules accepted: Orders, SmartSet

## 2020-01-11 ENCOUNTER — Telehealth: Payer: Self-pay | Admitting: *Deleted

## 2020-01-11 ENCOUNTER — Other Ambulatory Visit (HOSPITAL_COMMUNITY)
Admission: RE | Admit: 2020-01-11 | Discharge: 2020-01-11 | Disposition: A | Discharge status: Home / Self Care | Payer: Medicare Other | Source: Ambulatory Visit | Attending: Interventional Cardiology | Admitting: Interventional Cardiology

## 2020-01-11 DIAGNOSIS — Z01812 Encounter for preprocedural laboratory examination: Secondary | ICD-10-CM | POA: Diagnosis present

## 2020-01-11 DIAGNOSIS — Z20822 Contact with and (suspected) exposure to covid-19: Secondary | ICD-10-CM | POA: Insufficient documentation

## 2020-01-11 LAB — SARS CORONAVIRUS 2 (TAT 6-24 HRS): SARS Coronavirus 2: NEGATIVE

## 2020-01-11 NOTE — Telephone Encounter (Signed)
Pt contacted pre-catheterization scheduled at [facility] for: Wednesday January 13, 2020 10 AM Verified arrival time and place: [facility]) at: 8 AM   No solid food after midnight prior to cath, clear liquids until 5 AM day of procedure.  AM meds can be  taken pre-cath with sip of water including: ASA 81 mg   Confirmed patient has responsible adult to drive home post procedure and observe 24 hours after arriving home: yes  Currently, due to Covid-19 pandemic, only one person will be allowed with patient. Must be the same person for patient's entire stay and will be required to wear a mask. They will be asked to wait in the waiting room for the duration of the patient's stay.  Patients are required to wear a mask when they enter the hospital.      COVID-19 Pre-Screening Questions:  . In the past 7 to 10 days have you had a cough,  shortness of breath, headache, congestion, fever (100 or greater) body aches, chills, sore throat, or sudden loss of taste or sense of smell? no . Have you been around anyone with known Covid 19 in the past 7 to 10 days? no . Have you been around anyone who is awaiting Covid 19 test results in the past 7 to 10 days? no . Have you been around anyone who has mentioned symptoms of Covid 19 within the past 7 to 10 days? no  Reviewed procedure/mask/visitor instructions, COVID-19 screening questions with patient.

## 2020-01-13 ENCOUNTER — Other Ambulatory Visit: Payer: Self-pay

## 2020-01-13 ENCOUNTER — Encounter (HOSPITAL_COMMUNITY)
Admission: RE | Disposition: A | Discharge status: Home / Self Care | Payer: Medicare Other | Source: Home / Self Care | Attending: Interventional Cardiology

## 2020-01-13 ENCOUNTER — Ambulatory Visit (HOSPITAL_COMMUNITY)
Admission: RE | Admit: 2020-01-13 | Discharge: 2020-01-13 | Disposition: A | Discharge status: Home / Self Care | Payer: Medicare Other | Attending: Interventional Cardiology | Admitting: Interventional Cardiology

## 2020-01-13 DIAGNOSIS — Z7982 Long term (current) use of aspirin: Secondary | ICD-10-CM | POA: Diagnosis not present

## 2020-01-13 DIAGNOSIS — I472 Ventricular tachycardia: Secondary | ICD-10-CM | POA: Insufficient documentation

## 2020-01-13 DIAGNOSIS — R079 Chest pain, unspecified: Secondary | ICD-10-CM | POA: Diagnosis not present

## 2020-01-13 DIAGNOSIS — Z79899 Other long term (current) drug therapy: Secondary | ICD-10-CM | POA: Diagnosis not present

## 2020-01-13 DIAGNOSIS — Z87891 Personal history of nicotine dependence: Secondary | ICD-10-CM | POA: Insufficient documentation

## 2020-01-13 DIAGNOSIS — I420 Dilated cardiomyopathy: Secondary | ICD-10-CM | POA: Diagnosis present

## 2020-01-13 DIAGNOSIS — Z8546 Personal history of malignant neoplasm of prostate: Secondary | ICD-10-CM | POA: Diagnosis not present

## 2020-01-13 HISTORY — PX: LEFT HEART CATH AND CORONARY ANGIOGRAPHY: CATH118249

## 2020-01-13 SURGERY — LEFT HEART CATH AND CORONARY ANGIOGRAPHY
Anesthesia: LOCAL

## 2020-01-13 MED ORDER — IOHEXOL 350 MG/ML SOLN
INTRAVENOUS | Status: DC | PRN
  Administered 2020-01-13: 55 mL

## 2020-01-13 MED ORDER — SODIUM CHLORIDE 0.9 % IV SOLN: 250.0000 mL | INTRAVENOUS | Status: DC | PRN

## 2020-01-13 MED ORDER — VERAPAMIL HCL 2.5 MG/ML IV SOLN
INTRAVENOUS | Status: DC | PRN
  Administered 2020-01-13: 10 mL via INTRA_ARTERIAL

## 2020-01-13 MED ORDER — MIDAZOLAM HCL 2 MG/2ML IJ SOLN
INTRAMUSCULAR | Status: DC | PRN
  Administered 2020-01-13: 1 mg via INTRAVENOUS

## 2020-01-13 MED ORDER — HEPARIN (PORCINE) IN NACL 1000-0.9 UT/500ML-% IV SOLN
INTRAVENOUS | Status: DC | PRN
  Administered 2020-01-13 (×2): 500 mL

## 2020-01-13 MED ORDER — SODIUM CHLORIDE 0.9 % WEIGHT BASED INFUSION
3.0000 mL/kg/h | INTRAVENOUS | Status: AC
  Administered 2020-01-13: 3 mL/kg/h via INTRAVENOUS

## 2020-01-13 MED ORDER — HEPARIN (PORCINE) IN NACL 1000-0.9 UT/500ML-% IV SOLN
INTRAVENOUS | Status: AC
  Filled 2020-01-13: qty 1000

## 2020-01-13 MED ORDER — ASPIRIN 81 MG PO CHEW: 81.0000 mg | CHEWABLE_TABLET | ORAL | Status: DC

## 2020-01-13 MED ORDER — HEPARIN SODIUM (PORCINE) 1000 UNIT/ML IJ SOLN
INTRAMUSCULAR | Status: DC | PRN
  Administered 2020-01-13: 4000 [IU] via INTRAVENOUS

## 2020-01-13 MED ORDER — SODIUM CHLORIDE 0.9% FLUSH: 3.0000 mL | INTRAVENOUS | Status: DC | PRN

## 2020-01-13 MED ORDER — LIDOCAINE HCL (PF) 1 % IJ SOLN
INTRAMUSCULAR | Status: DC | PRN
  Administered 2020-01-13: 2 mL

## 2020-01-13 MED ORDER — FENTANYL CITRATE (PF) 100 MCG/2ML IJ SOLN
INTRAMUSCULAR | Status: AC
  Filled 2020-01-13: qty 2

## 2020-01-13 MED ORDER — MIDAZOLAM HCL 2 MG/2ML IJ SOLN
INTRAMUSCULAR | Status: AC
  Filled 2020-01-13: qty 2

## 2020-01-13 MED ORDER — FENTANYL CITRATE (PF) 100 MCG/2ML IJ SOLN
INTRAMUSCULAR | Status: DC | PRN
  Administered 2020-01-13: 25 ug via INTRAVENOUS

## 2020-01-13 MED ORDER — SODIUM CHLORIDE 0.9 % WEIGHT BASED INFUSION: 1.0000 mL/kg/h | INTRAVENOUS | Status: DC

## 2020-01-13 MED ORDER — VERAPAMIL HCL 2.5 MG/ML IV SOLN
INTRAVENOUS | Status: AC
  Filled 2020-01-13: qty 2

## 2020-01-13 MED ORDER — LIDOCAINE HCL (PF) 1 % IJ SOLN
INTRAMUSCULAR | Status: AC
  Filled 2020-01-13: qty 30

## 2020-01-13 MED ORDER — HEPARIN SODIUM (PORCINE) 1000 UNIT/ML IJ SOLN
INTRAMUSCULAR | Status: AC
  Filled 2020-01-13: qty 1

## 2020-01-13 SURGICAL SUPPLY — 10 items
CATH 5FR JL3.5 JR4 ANG PIG MP (CATHETERS) ×1 IMPLANT
DEVICE RAD COMP TR BAND LRG (VASCULAR PRODUCTS) ×1 IMPLANT
GLIDESHEATH SLEND SS 6F .021 (SHEATH) ×1 IMPLANT
GUIDEWIRE INQWIRE 1.5J.035X260 (WIRE) IMPLANT
INQWIRE 1.5J .035X260CM (WIRE) ×2
KIT HEART LEFT (KITS) ×2 IMPLANT
PACK CARDIAC CATHETERIZATION (CUSTOM PROCEDURE TRAY) ×2 IMPLANT
TRANSDUCER W/STOPCOCK (MISCELLANEOUS) ×2 IMPLANT
TUBING CIL FLEX 10 FLL-RA (TUBING) ×2 IMPLANT
VALVE MANIFOLD 3 PORT W/RA/ON (MISCELLANEOUS) ×1 IMPLANT

## 2020-01-13 NOTE — Interval H&P Note (Signed)
Cath Lab Visit (complete for each Cath Lab visit)  Clinical Evaluation Leading to the Procedure:   ACS: No.  Non-ACS:    Anginal Classification: CCS II  Anti-ischemic medical therapy: Minimal Therapy (1 class of medications)  Non-Invasive Test Results: High-risk stress test findings: cardiac mortality >3%/year  Prior CABG: No previous CABG    Low EF by echo  History and Physical Interval Note:  01/13/2020 9:34 AM  Mark Pruitt  has presented today for surgery, with the diagnosis of cardiomyopathy.  The various methods of treatment have been discussed with the patient and family. After consideration of risks, benefits and other options for treatment, the patient has consented to  Procedure(s): LEFT HEART CATH AND CORONARY ANGIOGRAPHY (N/A) as a surgical intervention.  The patient's history has been reviewed, patient examined, no change in status, stable for surgery.  I have reviewed the patient's chart and labs.  Questions were answered to the patient's satisfaction.     Mark Pruitt

## 2020-01-13 NOTE — Discharge Instructions (Signed)
Radial Site Care  This sheet gives you information about how to care for yourself after your procedure. Your health care provider may also give you more specific instructions. If you have problems or questions, contact your health care provider. What can I expect after the procedure? After the procedure, it is common to have:  Bruising and tenderness at the catheter insertion area. Follow these instructions at home: Medicines  Take over-the-counter and prescription medicines only as told by your health care provider. Insertion site care  Follow instructions from your health care provider about how to take care of your insertion site. Make sure you: ? Wash your hands with soap and water before you change your bandage (dressing). If soap and water are not available, use hand sanitizer. ? Change your dressing as told by your health care provider. ? Leave stitches (sutures), skin glue, or adhesive strips in place. These skin closures may need to stay in place for 2 weeks or longer. If adhesive strip edges start to loosen and curl up, you may trim the loose edges. Do not remove adhesive strips completely unless your health care provider tells you to do that.  Check your insertion site every day for signs of infection. Check for: ? Redness, swelling, or pain. ? Fluid or blood. ? Pus or a bad smell. ? Warmth.  Do not take baths, swim, or use a hot tub until your health care provider approves.  You may shower 24-48 hours after the procedure, or as directed by your health care provider. ? Remove the dressing and gently wash the site with plain soap and water. ? Pat the area dry with a clean towel. ? Do not rub the site. That could cause bleeding.  Do not apply powder or lotion to the site. Activity   For 24 hours after the procedure, or as directed by your health care provider: ? Do not flex or bend the affected arm. ? Do not push or pull heavy objects with the affected arm. ? Do not  drive yourself home from the hospital or clinic. You may drive 24 hours after the procedure unless your health care provider tells you not to. ? Do not operate machinery or power tools.  Do not lift anything that is heavier than 10 lb (4.5 kg), or the limit that you are told, until your health care provider says that it is safe.  Ask your health care provider when it is okay to: ? Return to work or school. ? Resume usual physical activities or sports. ? Resume sexual activity. General instructions  If the catheter site starts to bleed, raise your arm and put firm pressure on the site. If the bleeding does not stop, get help right away. This is a medical emergency.  If you went home on the same day as your procedure, a responsible adult should be with you for the first 24 hours after you arrive home.  Keep all follow-up visits as told by your health care provider. This is important. Contact a health care provider if:  You have a fever.  You have redness, swelling, or yellow drainage around your insertion site. Get help right away if:  You have unusual pain at the radial site.  The catheter insertion area swells very fast.  The insertion area is bleeding, and the bleeding does not stop when you hold steady pressure on the area.  Your arm or hand becomes pale, cool, tingly, or numb. These symptoms may represent a serious problem   that is an emergency. Do not wait to see if the symptoms will go away. Get medical help right away. Call your local emergency services (911 in the [place] Do not drive yourself to the hospital. Summary  After the procedure, it is common to have bruising and tenderness at the site.  Follow instructions from your health care provider about how to take care of your radial site wound. Check the wound every day for signs of infection.  Do not lift anything that is heavier than 10 lb (4.5 kg), or the limit that you are told, until your health care provider says  that it is safe. This information is not intended to replace advice given to you by your health care provider. Make sure you discuss any questions you have with your health care provider. Document Revised: 10/23/2017 Document Reviewed: 10/23/2017 Elsevier Patient Education  2020 Elsevier Inc.  

## 2020-01-21 ENCOUNTER — Telehealth: Payer: Self-pay | Admitting: Cardiology

## 2020-01-21 NOTE — Telephone Encounter (Signed)
Will route to Dr. Marlou Porch to see if any recommendations based on cath results.  Also, there is no current f/u scheduled.  Will see when pt needs to come back to the office.

## 2020-01-21 NOTE — Telephone Encounter (Signed)
   Pt is calling, he said it's been a week since his heart cath and he haven't heard anything from Dr. Marlou Porch. He wanted to speak with his nurse to know what to do next.  Please call

## 2020-01-22 NOTE — Telephone Encounter (Signed)
Please set him up an appt to discuss Candee Furbish, MD

## 2020-01-22 NOTE — Telephone Encounter (Signed)
Spoke with patient who reports cath site has heal without any problems.  Scheduled pt for f/u with Dr Marlou Porch 4/28.  He is aware of date and time.

## 2020-01-27 ENCOUNTER — Other Ambulatory Visit: Payer: Self-pay

## 2020-01-27 ENCOUNTER — Encounter: Payer: Self-pay | Admitting: Cardiology

## 2020-01-27 ENCOUNTER — Ambulatory Visit: Payer: Medicare Other | Admitting: Cardiology

## 2020-01-27 VITALS — BP 100/60 | HR 68 | Ht 71.0 in | Wt 184.0 lb

## 2020-01-27 DIAGNOSIS — I493 Ventricular premature depolarization: Secondary | ICD-10-CM

## 2020-01-27 DIAGNOSIS — I420 Dilated cardiomyopathy: Secondary | ICD-10-CM

## 2020-01-27 MED ORDER — METOPROLOL SUCCINATE ER 50 MG PO TB24: 50.0000 mg | ORAL_TABLET | Freq: Every day | ORAL | 3 refills | Status: DC

## 2020-01-27 NOTE — Patient Instructions (Signed)
Medication Instructions:  Please increase your Metoprolol Succinate to 50 mg a day.  Continue all other medications as listed.  *If you need a refill on your cardiac medications before your next appointment, please call your pharmacy*  You have been referred to see Dr Crissie Sickles regarding PVCs.  Follow-Up: At [facility], you and your health needs are our priority.  As part of our continuing mission to provide you with exceptional heart care, we have created designated Provider Care Teams.  These Care Teams include your primary Cardiologist (physician) and Advanced Practice Providers (APPs -  Physician Assistants and Nurse Practitioners) who all work together to provide you with the care you need, when you need it.  We recommend signing up for the patient portal called "MyChart".  Sign up information is provided on this After Visit Summary.  MyChart is used to connect with patients for Virtual Visits (Telemedicine).  Patients are able to view lab/test results, encounter notes, upcoming appointments, etc.  Non-urgent messages can be sent to your provider as well.   To learn more about what you can do with MyChart, go to [email or web address].    Your next appointment:   2 month(s)  The format for your next appointment:   In Person  Provider:   Candee Furbish, MD   Thank you for choosing [facility]!!

## 2020-01-27 NOTE — Progress Notes (Signed)
Cardiology Office Note:    Date:  01/27/2020   ID:  Mark Pruitt, DOB 1940/11/04, MRN [id number]  PCP:  Mark Post, MD  Cardiologist:  Mark Furbish, MD  Electrophysiologist:  None   Referring MD: Mark Post, MD     History of Present Illness:    Mark Pruitt is a 79 y.o. male with newly discovered cardiomyopathy, frequent PVCs approximately 12% of monitoring time, nonischemic cardiomyopathy-no CAD, relatively soft blood pressure.  NYHA class I.  Enjoys golf.  No syncope.  No significant shortness of breath.  No orthopnea.   Cath : There is moderate left ventricular systolic dysfunction.  The left ventricular ejection fraction is 25-35% by visual estimate.  LV end diastolic pressure is normal.  There is no aortic valve stenosis.  Ectasia in all three coronary arteries. Paroxysmal SVT noted with a HR to 115 bpm. Patient was asymptomatic.  ECHO   1. Left ventricular ejection fraction, by estimation, is 40 to 45%. The  left ventricle has mildly decreased function. The left ventricle  demonstrates global hypokinesis, most pronounced at base with improvement  in function towards apex. The left  ventricular internal cavity size was mildly dilated. There is mild left  ventricular hypertrophy. Left ventricular diastolic function could not be  evaluated, as tissue Doppler was not done.  2. Right ventricular systolic function is normal. The right ventricular  size is normal. There is normal pulmonary artery systolic pressure. The  estimated right ventricular systolic pressure is [id number] mmHg.  3. The mitral valve is normal in structure. Mild mitral valve  regurgitation.  4. The aortic valve is tricuspid. Aortic valve regurgitation is trivial.  No aortic stenosis is present.  5. Aortic dilatation noted. There is mild dilatation of the ascending  aorta measuring 37 mm.  6. The inferior vena cava is normal in size with greater than 50%   respiratory variability, suggesting right atrial pressure of 3 mmHg  Montitor:  Sinus rhythm with frequent isolated PVC's (12%)  Rare episodes of slow ventricular tachycardia (6-8 beats with avg of 127 bpm)  Brief paroxysmal atrial tachycardia episodes  No atrial fibrillation, no pauses   Will discuss metoprolol with him at follow up visit    Past Medical History:  Diagnosis Date  . Cancer [facility])    prostate cancer  . Cataract   . COLONIC POLYPS 07/25/2009  . ELEVATED PROSTATE SPECIFIC ANTIGEN 07/27/2009  . Hearing loss of both ears   . HYPERTROPHY PROSTATE W/UR OBST & OTH LUTS 07/25/2009  . Irregular heart beat   . LICHEN SIMPLEX CHRONICUS 02/03/2009  . Other primary cardiomyopathies 08/08/2010    Past Surgical History:  Procedure Laterality Date  . COLONOSCOPY    . LEFT HEART CATH AND CORONARY ANGIOGRAPHY N/A 01/13/2020   Procedure: LEFT HEART CATH AND CORONARY ANGIOGRAPHY;  Surgeon: Jettie Booze, MD;  Location: [facility];  Service: Cardiovascular;  Laterality: N/A;  . POLYPECTOMY    . PROSTATE SURGERY      Current Medications: Current Meds  Medication Sig  . aspirin EC 81 MG tablet Take 81 mg by mouth daily.  Marland Kitchen Bioflavonoid Products (ESTER-C) TABS Take 1 tablet by mouth daily.  . cholecalciferol (VITAMIN D) 25 MCG (1000 UNIT) tablet Take 1,000 Units by mouth daily.  Marland Kitchen lisinopril (ZESTRIL) 5 MG tablet TAKE 1 TABLET BY MOUTH  DAILY  . Multiple Vitamin (MULTIVITAMIN WITH MINERALS) TABS tablet Take 1 tablet by mouth daily.  . nabumetone (RELAFEN) 750  MG tablet Take 1 tablet (750 mg total) by mouth 2 (two) times daily as needed.  . Omega-3 Fatty Acids (FISH OIL) 1000 MG CAPS Take 1,000 mg by mouth daily.   . [DISCONTINUED] metoprolol succinate (TOPROL XL) 25 MG 24 hr tablet Take 1 tablet (25 mg total) by mouth daily.     Allergies:   Patient has no known allergies.   Social History   Socioeconomic History  . Marital status: Married    Spouse name:  Not on file  . Number of children: Not on file  . Years of education: Not on file  . Highest education level: Not on file  Occupational History  . Not on file  Tobacco Use  . Smoking status: Former Smoker    Packs/day: 1.00    Years: 20.00    Pack years: 20.00    Types: Cigarettes    Quit date: 10/22/1983    Years since quitting: 36.2  . Smokeless tobacco: Never Used  Substance and Sexual Activity  . Alcohol use: No    Alcohol/week: 0.0 standard drinks  . Drug use: No  . Sexual activity: Not on file  Other Topics Concern  . Not on file  Social History Narrative   From [place] originally    Social Determinants of Health   Financial Resource Strain:   . Difficulty of Paying Living Expenses:   Food Insecurity:   . Worried About Charity fundraiser in the Last Year:   . Arboriculturist in the Last Year:   Transportation Needs:   . Film/video editor (Medical):   Marland Kitchen Lack of Transportation (Non-Medical):   Physical Activity:   . Days of Exercise per Week:   . Minutes of Exercise per Session:   Stress:   . Feeling of Stress :   Social Connections:   . Frequency of Communication with Friends and Family:   . Frequency of Social Gatherings with Friends and Family:   . Attends Religious Services:   . Active Member of Clubs or Organizations:   . Attends Archivist Meetings:   Marland Kitchen Marital Status:      Family History: The patient's family history includes Anuerysm (age of onset: 85) in his father; Cancer in his mother. There is no history of Arthritis, Colon cancer, Esophageal cancer, Rectal cancer, or Stomach cancer.  ROS:   Please see the history of present illness.     All other systems reviewed and are negative.  EKGs/Labs/Other Studies Reviewed:      Recent Labs: 05/18/2019: ALT 13; TSH 1.84 01/06/2020: BUN 17; Creatinine, Ser 0.94; Hemoglobin 15.0; Platelets 185; Potassium 4.6; Sodium 137  Recent Lipid Panel    Component Value Date/Time   CHOL 186  05/18/2019 1050   TRIG 82.0 05/18/2019 1050   HDL 64.30 05/18/2019 1050   CHOLHDL 3 05/18/2019 1050   VLDL 16.4 05/18/2019 1050   LDLCALC 105 (H) 05/18/2019 1050    Physical Exam:    VS:  BP 100/60   Pulse 68   Ht 5\' 11"  (1.803 m)   Wt 184 lb (83.5 kg)   SpO2 97%   BMI 25.66 kg/m     Wt Readings from Last 3 Encounters:  01/27/20 184 lb (83.5 kg)  01/13/20 178 lb (80.7 kg)  01/06/20 185 lb 6.4 oz (84.1 kg)     GEN:  Well nourished, well developed in no acute distress HEENT: Normal NECK: No JVD; No carotid bruits LYMPHATICS: No lymphadenopathy CARDIAC:  RRR, no murmurs, rubs, gallops, frequent ectopy heard RESPIRATORY:  Clear to auscultation without rales, wheezing or rhonchi  ABDOMEN: Soft, non-tender, non-distended MUSCULOSKELETAL:  No edema; No deformity  SKIN: Warm and dry NEUROLOGIC:  Alert and oriented x 3 PSYCHIATRIC:  Normal affect   ASSESSMENT:    1. Dilated cardiomyopathy ([place])   2. PVC's (premature ventricular contractions)    PLAN:    In order of problems listed above:  Dilated cardiomyopathy-nonischemic Frequent PVCs-12% event monitor -We will go ahead and refer him to electrophysiology.  I wonder if we were able to suppress these PVCs or potentially ablate with this rid him of his cardiomyopathy or improve his cardiomyopathy?. -I will increase his Toprol from 25-50.  Blood pressure relatively soft.  I will continue with lisinopril 5 mg once a day.  I do not think he would necessarily be able to tolerate Entresto because of soft blood pressure. -Thankfully, currently NYHA class I.  Enjoys golf.  No high risk symptoms such as syncope.  Prior diffuse chest pressure/palpitations -Thankfully, no CAD.?  Secondary to paroxysmal tachycardia?.  Increasing Toprol.  Medication Adjustments/Labs and Tests Ordered: Current medicines are reviewed at length with the patient today.  Concerns regarding medicines are outlined above.  Orders Placed This Encounter   Procedures  . Ambulatory referral to Cardiac Electrophysiology   Meds ordered this encounter  Medications  . metoprolol succinate (TOPROL-XL) 50 MG 24 hr tablet    Sig: Take 1 tablet (50 mg total) by mouth daily. Take with or immediately following a meal.    Dispense:  90 tablet    Refill:  3    Patient Instructions  Medication Instructions:  Please increase your Metoprolol Succinate to 50 mg a day.  Continue all other medications as listed.  *If you need a refill on your cardiac medications before your next appointment, please call your pharmacy*  You have been referred to see Dr Crissie Sickles regarding PVCs.  Follow-Up: At [facility], you and your health needs are our priority.  As part of our continuing mission to provide you with exceptional heart care, we have created designated Provider Care Teams.  These Care Teams include your primary Cardiologist (physician) and Advanced Practice Providers (APPs -  Physician Assistants and Nurse Practitioners) who all work together to provide you with the care you need, when you need it.  We recommend signing up for the patient portal called "MyChart".  Sign up information is provided on this After Visit Summary.  MyChart is used to connect with patients for Virtual Visits (Telemedicine).  Patients are able to view lab/test results, encounter notes, upcoming appointments, etc.  Non-urgent messages can be sent to your provider as well.   To learn more about what you can do with MyChart, go to [email or web address].    Your next appointment:   2 month(s)  The format for your next appointment:   In Person  Provider:   Candee Furbish, MD   Thank you for choosing [facility]!!        Signed, Mark Furbish, MD  01/27/2020 9:16 AM    [place]

## 2020-02-10 ENCOUNTER — Other Ambulatory Visit: Payer: Self-pay

## 2020-02-10 ENCOUNTER — Encounter: Payer: Self-pay | Admitting: Internal Medicine

## 2020-02-10 ENCOUNTER — Ambulatory Visit: Payer: Medicare Other | Admitting: Internal Medicine

## 2020-02-10 VITALS — BP 100/60 | HR 47 | Ht 71.0 in | Wt 184.0 lb

## 2020-02-10 DIAGNOSIS — I493 Ventricular premature depolarization: Secondary | ICD-10-CM

## 2020-02-10 DIAGNOSIS — I420 Dilated cardiomyopathy: Secondary | ICD-10-CM | POA: Diagnosis not present

## 2020-02-10 NOTE — Patient Instructions (Addendum)
Medication Instructions:  Your physician recommends that you continue on your current medications as directed. Please refer to the Current Medication list given to you today.  Labwork: None ordered.  Testing/Procedures: Your physician has requested that you have an echocardiogram. Echocardiography is a painless test that uses sound waves to create images of your heart. It provides your doctor with information about the size and shape of your heart and how well your heart's chambers and valves are working. This procedure takes approximately one hour. There are no restrictions for this procedure.  Please schedule for ECHO end of July  Follow-Up: Your physician wants you to follow-up in: With Dr. Lovena Le based on echo results  Any Other Special Instructions Will Be Listed Below (If Applicable).  If you need a refill on your cardiac medications before your next appointment, please call your pharmacy.

## 2020-02-10 NOTE — Progress Notes (Signed)
HPI Mark Pruitt is referred today for evaluation of LV dysfunction. He has no CAD by cath. He has an EF of 25%. He has PVC's. He felt palpitations but has not had any sustained VT. He has been placed on medical therapy with a beta blocker and his palpitations are improved. He denies syncope. In addition he has sinus node dysfunction with resting HR's in the high 40's.  No Known Allergies   Current Outpatient Medications  Medication Sig Dispense Refill  . aspirin EC 81 MG tablet Take 81 mg by mouth daily.    Mark Kitchen Bioflavonoid Products (ESTER-C) TABS Take 1 tablet by mouth daily.    . cholecalciferol (VITAMIN D) 25 MCG (1000 UNIT) tablet Take 1,000 Units by mouth daily.    Mark Kitchen lisinopril (ZESTRIL) 5 MG tablet TAKE 1 TABLET BY MOUTH  DAILY 90 tablet 1  . metoprolol succinate (TOPROL-XL) 50 MG 24 hr tablet Take 1 tablet (50 mg total) by mouth daily. Take with or immediately following a meal. 90 tablet 3  . Multiple Vitamin (MULTIVITAMIN WITH MINERALS) TABS tablet Take 1 tablet by mouth daily.    . nabumetone (RELAFEN) 750 MG tablet Take 1 tablet (750 mg total) by mouth 2 (two) times daily as needed. 60 tablet 6  . Omega-3 Fatty Acids (FISH OIL) 1000 MG CAPS Take 1,000 mg by mouth daily.      No current facility-administered medications for this visit.     Past Medical History:  Diagnosis Date  . Cancer [facility])    prostate cancer  . Cataract   . COLONIC POLYPS 07/25/2009  . ELEVATED PROSTATE SPECIFIC ANTIGEN 07/27/2009  . Hearing loss of both ears   . HYPERTROPHY PROSTATE W/UR OBST & OTH LUTS 07/25/2009  . Irregular heart beat   . LICHEN SIMPLEX CHRONICUS 02/03/2009  . Other primary cardiomyopathies 08/08/2010    ROS:   All systems reviewed and negative except as noted in the HPI.   Past Surgical History:  Procedure Laterality Date  . COLONOSCOPY    . LEFT HEART CATH AND CORONARY ANGIOGRAPHY N/A 01/13/2020   Procedure: LEFT HEART CATH AND CORONARY ANGIOGRAPHY;  Surgeon:  Jettie Booze, MD;  Location: [facility];  Service: Cardiovascular;  Laterality: N/A;  . POLYPECTOMY    . PROSTATE SURGERY       Family History  Problem Relation Age of Onset  . Cancer Mother        parathyroid adenoma  . Anuerysm Father 43       abdominal  . Arthritis Neg Hx        family hx  . Colon cancer Neg Hx   . Esophageal cancer Neg Hx   . Rectal cancer Neg Hx   . Stomach cancer Neg Hx      Social History   Socioeconomic History  . Marital status: Married    Spouse name: Not on file  . Number of children: Not on file  . Years of education: Not on file  . Highest education level: Not on file  Occupational History  . Not on file  Tobacco Use  . Smoking status: Former Smoker    Packs/day: 1.00    Years: 20.00    Pack years: 20.00    Types: Cigarettes    Quit date: 10/22/1983    Years since quitting: 36.3  . Smokeless tobacco: Never Used  Substance and Sexual Activity  . Alcohol use: No    Alcohol/week: 0.0 standard drinks  .  Drug use: No  . Sexual activity: Not on file  Other Topics Concern  . Not on file  Social History Narrative   From [place] originally    Social Determinants of Health   Financial Resource Strain:   . Difficulty of Paying Living Expenses:   Food Insecurity:   . Worried About Charity fundraiser in the Last Year:   . Arboriculturist in the Last Year:   Transportation Needs:   . Film/video editor (Medical):   Mark Kitchen Lack of Transportation (Non-Medical):   Physical Activity:   . Days of Exercise per Week:   . Minutes of Exercise per Session:   Stress:   . Feeling of Stress :   Social Connections:   . Frequency of Communication with Friends and Family:   . Frequency of Social Gatherings with Friends and Family:   . Attends Religious Services:   . Active Member of Clubs or Organizations:   . Attends Archivist Meetings:   Mark Kitchen Marital Status:   Intimate Partner Violence:   . Fear of Current or  Ex-Partner:   . Emotionally Abused:   Mark Kitchen Physically Abused:   . Sexually Abused:      BP 100/60   Pulse (!) 47   Ht 5\' 11"  (1.803 m)   Wt 184 lb (83.5 kg)   SpO2 98%   BMI 25.66 kg/m   Physical Exam:  Well appearing NAD HEENT: Unremarkable Neck:  No JVD, no thyromegally Lymphatics:  No adenopathy Back:  No CVA tenderness Lungs:  Clear with no wheezes HEART:  Regular brady rhythm, no murmurs, no rubs, no clicks Abd:  soft, positive bowel sounds, no organomegally, no rebound, no guarding Ext:  2 plus pulses, no edema, no cyanosis, no clubbing Skin:  No rashes no nodules Neuro:  CN II through XII intact, motor grossly intact  EKG - sinus bradycardia  Assess/Plan: 1. Chronic systolic heart failure - his symptoms are class 2. He will continue his current meds and we will recheck his echo in a month or so. If his EF remains down, then DDD ICD would be a strong consideration. 2. Sinus node dysfunction - he is minimally symptomatic. He will get an atrial lead if he ultimately requires an ICD. 3. PVC's - He had 8% PVC's. He is minimally symptomatic.   Mikle [email or web address].D.

## 2020-03-09 ENCOUNTER — Ambulatory Visit (INDEPENDENT_AMBULATORY_CARE_PROVIDER_SITE_OTHER): Payer: Medicare Other | Admitting: Adult Health

## 2020-03-09 ENCOUNTER — Other Ambulatory Visit: Payer: Self-pay

## 2020-03-09 ENCOUNTER — Encounter: Payer: Self-pay | Admitting: Adult Health

## 2020-03-09 VITALS — BP 100/54 | Temp 98.0°F | Wt 184.0 lb

## 2020-03-09 DIAGNOSIS — T25111A Burn of first degree of right ankle, initial encounter: Secondary | ICD-10-CM

## 2020-03-09 NOTE — Progress Notes (Signed)
Subjective:    Patient ID: Mark Pruitt, male    DOB: 1941/06/23, 79 y.o.   MRN: [id number]  HPI  79 year old male who  has a past medical history of Cancer ([place]), Cataract, COLONIC POLYPS (07/25/2009), ELEVATED PROSTATE SPECIFIC ANTIGEN (07/27/2009), Hearing loss of both ears, HYPERTROPHY PROSTATE W/UR OBST & OTH LUTS (07/25/2009), Irregular heart beat, LICHEN SIMPLEX CHRONICUS (02/03/2009), and Other primary cardiomyopathies (08/08/2010).  He presents to the office today for an acute issue of a burn on his right ankle.  He reports that 3 days ago he dropped a cup of hot water which caused a burn.  Once the blister has been broken he has been applying Neosporin and a bandage.  He wants to make sure that the wound is not infected  Review of Systems See HPI   Past Medical History:  Diagnosis Date  . Cancer [facility])    prostate cancer  . Cataract   . COLONIC POLYPS 07/25/2009  . ELEVATED PROSTATE SPECIFIC ANTIGEN 07/27/2009  . Hearing loss of both ears   . HYPERTROPHY PROSTATE W/UR OBST & OTH LUTS 07/25/2009  . Irregular heart beat   . LICHEN SIMPLEX CHRONICUS 02/03/2009  . Other primary cardiomyopathies 08/08/2010    Social History   Socioeconomic History  . Marital status: Married    Spouse name: Not on file  . Number of children: Not on file  . Years of education: Not on file  . Highest education level: Not on file  Occupational History  . Not on file  Tobacco Use  . Smoking status: Former Smoker    Packs/day: 1.00    Years: 20.00    Pack years: 20.00    Types: Cigarettes    Quit date: 10/22/1983    Years since quitting: 36.4  . Smokeless tobacco: Never Used  Substance and Sexual Activity  . Alcohol use: No    Alcohol/week: 0.0 standard drinks  . Drug use: No  . Sexual activity: Not on file  Other Topics Concern  . Not on file  Social History Narrative   From [place] originally    Social Determinants of Health   Financial Resource Strain:   . Difficulty  of Paying Living Expenses:   Food Insecurity:   . Worried About Charity fundraiser in the Last Year:   . Arboriculturist in the Last Year:   Transportation Needs:   . Film/video editor (Medical):   Marland Kitchen Lack of Transportation (Non-Medical):   Physical Activity:   . Days of Exercise per Week:   . Minutes of Exercise per Session:   Stress:   . Feeling of Stress :   Social Connections:   . Frequency of Communication with Friends and Family:   . Frequency of Social Gatherings with Friends and Family:   . Attends Religious Services:   . Active Member of Clubs or Organizations:   . Attends Archivist Meetings:   Marland Kitchen Marital Status:   Intimate Partner Violence:   . Fear of Current or Ex-Partner:   . Emotionally Abused:   Marland Kitchen Physically Abused:   . Sexually Abused:     Past Surgical History:  Procedure Laterality Date  . COLONOSCOPY    . LEFT HEART CATH AND CORONARY ANGIOGRAPHY N/A 01/13/2020   Procedure: LEFT HEART CATH AND CORONARY ANGIOGRAPHY;  Surgeon: Jettie Booze, MD;  Location: [place] [facility];  Service: Cardiovascular;  Laterality: N/A;  . POLYPECTOMY    .  PROSTATE SURGERY      Family History  Problem Relation Age of Onset  . Cancer Mother        parathyroid adenoma  . Anuerysm Father 17       abdominal  . Arthritis Neg Hx        family hx  . Colon cancer Neg Hx   . Esophageal cancer Neg Hx   . Rectal cancer Neg Hx   . Stomach cancer Neg Hx     No Known Allergies  Current Outpatient Medications on File Prior to Visit  Medication Sig Dispense Refill  . aspirin EC 81 MG tablet Take 81 mg by mouth daily.    Marland Kitchen Bioflavonoid Products (ESTER-C) TABS Take 1 tablet by mouth daily.    . cholecalciferol (VITAMIN D) 25 MCG (1000 UNIT) tablet Take 1,000 Units by mouth daily.    Marland Kitchen lisinopril (ZESTRIL) 5 MG tablet TAKE 1 TABLET BY MOUTH  DAILY 90 tablet 1  . metoprolol succinate (TOPROL-XL) 50 MG 24 hr tablet Take 1 tablet (50 mg total) by mouth daily.  Take with or immediately following a meal. 90 tablet 3  . Multiple Vitamin (MULTIVITAMIN WITH MINERALS) TABS tablet Take 1 tablet by mouth daily.    . nabumetone (RELAFEN) 750 MG tablet Take 1 tablet (750 mg total) by mouth 2 (two) times daily as needed. 60 tablet 6  . Omega-3 Fatty Acids (FISH OIL) 1000 MG CAPS Take 1,000 mg by mouth daily.      No current facility-administered medications on file prior to visit.    BP (!) 100/54   Temp 98 F (36.7 C)   Wt 184 lb (83.5 kg)   BMI 25.66 kg/m       Objective:   Physical Exam Vitals and nursing note reviewed.  Constitutional:      Appearance: Normal appearance.  Skin:    General: Skin is warm and dry.     Findings: Burn present.     Comments: 3 inch first-degree burn noted on right ankle.  No localized infection noted  Neurological:     Mental Status: He is alert.       Assessment & Plan:  1. Superficial burn of right ankle, initial encounter Silver Sulfadiazine Cream applied to wound and dressed with non stick bandage and kerlex. He was given some Silver Sulfadiazine Cream to go home with. Advised to apply BID x 7-10 days to help with wound healing.  - red flags reviewed and return precautions expressed  Dorothyann Peng, NP

## 2020-04-11 ENCOUNTER — Other Ambulatory Visit: Payer: Self-pay

## 2020-04-11 ENCOUNTER — Encounter: Payer: Self-pay | Admitting: Cardiology

## 2020-04-11 ENCOUNTER — Ambulatory Visit: Payer: Medicare Other | Admitting: Cardiology

## 2020-04-11 VITALS — BP 110/60 | HR 53 | Ht 71.0 in | Wt 183.0 lb

## 2020-04-11 DIAGNOSIS — I493 Ventricular premature depolarization: Secondary | ICD-10-CM | POA: Diagnosis not present

## 2020-04-11 DIAGNOSIS — I420 Dilated cardiomyopathy: Secondary | ICD-10-CM

## 2020-04-11 DIAGNOSIS — I472 Ventricular tachycardia, unspecified: Secondary | ICD-10-CM

## 2020-04-11 NOTE — Progress Notes (Signed)
Cardiology Office Note:    Date:  04/11/2020   ID:  Mark Pruitt, DOB [birth year], MRN [id number]  PCP:  Eulas Post, MD  Cardiologist:  Candee Furbish, MD  Electrophysiologist:  None   Referring MD: Eulas Post, MD     History of Present Illness:    Mark Pruitt is a 79 y.o. male with newly discovered cardiomyopathy, frequent PVCs approximately 8% of monitoring time, nonischemic cardiomyopathy-no CAD, relatively soft blood pressure.  NYHA class 2.  Enjoys golf.  No syncope.  No significant shortness of breath.  No orthopnea.  Still doing quite well.  Tolerating his medications well.  Appreciated visit with Dr. Lovena Le.  Has upcoming echocardiogram.   Cath : There is moderate left ventricular systolic dysfunction.  The left ventricular ejection fraction is 25-35% by visual estimate.  LV end diastolic pressure is normal.  There is no aortic valve stenosis.  Ectasia in all three coronary arteries. Paroxysmal SVT noted with a HR to 115 bpm. Patient was asymptomatic.  ECHO 12/29/19   1. Left ventricular ejection fraction, by estimation, is 40 to 45%. The  left ventricle has mildly decreased function. The left ventricle  demonstrates global hypokinesis, most pronounced at base with improvement  in function towards apex. The left  ventricular internal cavity size was mildly dilated. There is mild left  ventricular hypertrophy. Left ventricular diastolic function could not be  evaluated, as tissue Doppler was not done.  2. Right ventricular systolic function is normal. The right ventricular  size is normal. There is normal pulmonary artery systolic pressure. The  estimated right ventricular systolic pressure is 65.9 mmHg.  3. The mitral valve is normal in structure. Mild mitral valve  regurgitation.  4. The aortic valve is tricuspid. Aortic valve regurgitation is trivial.  No aortic stenosis is present.  5. Aortic dilatation noted. There is mild  dilatation of the ascending  aorta measuring 37 mm.  6. The inferior vena cava is normal in size with greater than 50%  respiratory variability, suggesting right atrial pressure of 3 mmHg  Montitor:  Sinus rhythm with frequent isolated PVC's (12%)  Rare episodes of slow ventricular tachycardia (6-8 beats with avg of 127 bpm)  Brief paroxysmal atrial tachycardia episodes  No atrial fibrillation, no pauses       Past Medical History:  Diagnosis Date  . Cancer [facility])    prostate cancer  . Cataract   . COLONIC POLYPS 07/25/2009  . ELEVATED PROSTATE SPECIFIC ANTIGEN 07/27/2009  . Hearing loss of both ears   . HYPERTROPHY PROSTATE W/UR OBST & OTH LUTS 07/25/2009  . Irregular heart beat   . LICHEN SIMPLEX CHRONICUS 02/03/2009  . Other primary cardiomyopathies 08/08/2010    Past Surgical History:  Procedure Laterality Date  . COLONOSCOPY    . LEFT HEART CATH AND CORONARY ANGIOGRAPHY N/A 01/13/2020   Procedure: LEFT HEART CATH AND CORONARY ANGIOGRAPHY;  Surgeon: Jettie Booze, MD;  Location: [place] [facility];  Service: Cardiovascular;  Laterality: N/A;  . POLYPECTOMY    . PROSTATE SURGERY      Current Medications: Current Meds  Medication Sig  . aspirin EC 81 MG tablet Take 81 mg by mouth daily.  Marland Kitchen Bioflavonoid Products (ESTER-C) TABS Take 1 tablet by mouth daily.  . cholecalciferol (VITAMIN D) 25 MCG (1000 UNIT) tablet Take 1,000 Units by mouth daily.  Marland Kitchen lisinopril (ZESTRIL) 5 MG tablet TAKE 1 TABLET BY MOUTH  DAILY  . metoprolol succinate (TOPROL-XL) 50 MG  24 hr tablet Take 1 tablet (50 mg total) by mouth daily. Take with or immediately following a meal.  . Multiple Vitamin (MULTIVITAMIN WITH MINERALS) TABS tablet Take 1 tablet by mouth daily.  . nabumetone (RELAFEN) 750 MG tablet Take 1 tablet (750 mg total) by mouth 2 (two) times daily as needed.  . Omega-3 Fatty Acids (FISH OIL) 1000 MG CAPS Take 1,000 mg by mouth daily.      Allergies:   Patient has no known  allergies.   Social History   Socioeconomic History  . Marital status: Married    Spouse name: Not on file  . Number of children: Not on file  . Years of education: Not on file  . Highest education level: Not on file  Occupational History  . Not on file  Tobacco Use  . Smoking status: Former Smoker    Packs/day: 1.00    Years: 20.00    Pack years: 20.00    Types: Cigarettes    Quit date: 10/22/1983    Years since quitting: 36.4  . Smokeless tobacco: Never Used  Vaping Use  . Vaping Use: Never used  Substance and Sexual Activity  . Alcohol use: No    Alcohol/week: 0.0 standard drinks  . Drug use: No  . Sexual activity: Not on file  Other Topics Concern  . Not on file  Social History Narrative   From [place] originally    Social Determinants of Health   Financial Resource Strain:   . Difficulty of Paying Living Expenses:   Food Insecurity:   . Worried About Charity fundraiser in the Last Year:   . Arboriculturist in the Last Year:   Transportation Needs:   . Film/video editor (Medical):   Marland Kitchen Lack of Transportation (Non-Medical):   Physical Activity:   . Days of Exercise per Week:   . Minutes of Exercise per Session:   Stress:   . Feeling of Stress :   Social Connections:   . Frequency of Communication with Friends and Family:   . Frequency of Social Gatherings with Friends and Family:   . Attends Religious Services:   . Active Member of Clubs or Organizations:   . Attends Archivist Meetings:   Marland Kitchen Marital Status:      Family History: The patient's family history includes Anuerysm (age of onset: 37) in his father; Cancer in his mother. There is no history of Arthritis, Colon cancer, Esophageal cancer, Rectal cancer, or Stomach cancer.  ROS:   Please see the history of present illness.     All other systems reviewed and are negative.  EKGs/Labs/Other Studies Reviewed:      Recent Labs: 05/18/2019: ALT 13; TSH 1.84 01/06/2020: BUN 17;  Creatinine, Ser 0.94; Hemoglobin 15.0; Platelets 185; Potassium 4.6; Sodium 137  Recent Lipid Panel    Component Value Date/Time   CHOL 186 05/18/2019 1050   TRIG 82.0 05/18/2019 1050   HDL 64.30 05/18/2019 1050   CHOLHDL 3 05/18/2019 1050   VLDL 16.4 05/18/2019 1050   LDLCALC 105 (H) 05/18/2019 1050    Physical Exam:    VS:  BP 110/60   Pulse (!) 53   Ht 5\' 11"  (1.803 m)   Wt 183 lb (83 kg)   SpO2 99%   BMI 25.52 kg/m     Wt Readings from Last 3 Encounters:  04/11/20 183 lb (83 kg)  03/09/20 184 lb (83.5 kg)  02/10/20 184 lb (83.5 kg)  GEN: Well nourished, well developed, in no acute distress  HEENT: normal  Neck: no JVD, carotid bruits, or masses Cardiac: RRR; no murmurs, rubs, or gallops,no edema  Respiratory:  clear to auscultation bilaterally, normal work of breathing GI: soft, nontender, nondistended, + BS MS: no deformity or atrophy  Skin: warm and dry, no rash Neuro:  Alert and Oriented x 3, Strength and sensation are intact Psych: euthymic mood, full affect   ASSESSMENT:    1. Dilated cardiomyopathy ([place])   2. PVC's (premature ventricular contractions)   3. Ventricular tachycardia ([place])    PLAN:    In order of problems listed above:  Dilated cardiomyopathy-nonischemic EF 33% on stress test 12/29/2019 Frequent PVCs-8% event monitor, also had frequent PACs. -Appreciate Dr. Tanna Furry consult.  He states that he had 8% PVCs and he was minimally symptomatic.  Does not mention ablative therapy.  -At last visit we gently increased Toprol from 25-50.  Blood pressure relatively soft.  I will continue with lisinopril 5 mg once a day.  I do not think he would necessarily be able to tolerate Entresto because of soft blood pressure. -Thankfully, currently NYHA class 2.  Enjoys golf.  No high risk symptoms such as syncope. -We will go ahead and repeat echocardiogram to see if there has been continued improvement.  If EF below 35%, ICD should be entertained.   Appreciate Dr. Tanna Furry advice.  He would need an atrial lead. Cardiac catheterization EF was 25-35, nuclear EF was 33, echo EF was 40-45, some discrepancies noted.  Repeating echocardiogram at the end of July.  Prior diffuse chest pressure/palpitations -Thankfully, no CAD.  Increasing Toprol at prior visit.  Medication Adjustments/Labs and Tests Ordered: Current medicines are reviewed at length with the patient today.  Concerns regarding medicines are outlined above.  No orders of the defined types were placed in this encounter.  No orders of the defined types were placed in this encounter.   Patient Instructions  Medication Instructions:  The current medical regimen is effective;  continue present plan and medications.  *If you need a refill on your cardiac medications before your next appointment, please call your pharmacy*  Testing/Procedures:  Echo as scheduled.  Follow-Up: At [facility], you and your health needs are our priority.  As part of our continuing mission to provide you with exceptional heart care, we have created designated Provider Care Teams.  These Care Teams include your primary Cardiologist (physician) and Advanced Practice Providers (APPs -  Physician Assistants and Nurse Practitioners) who all work together to provide you with the care you need, when you need it.  We recommend signing up for the patient portal called "MyChart".  Sign up information is provided on this After Visit Summary.  MyChart is used to connect with patients for Virtual Visits (Telemedicine).  Patients are able to view lab/test results, encounter notes, upcoming appointments, etc.  Non-urgent messages can be sent to your provider as well.   To learn more about what you can do with MyChart, go to [email or web address].    Your next appointment:   6 month(s)  The format for your next appointment:   In Person  Provider:   Candee Furbish, MD   Thank you for choosing [facility]!!         Signed, Candee Furbish, MD  04/11/2020 9:31 AM    [place]

## 2020-04-11 NOTE — Patient Instructions (Signed)
Medication Instructions:  The current medical regimen is effective;  continue present plan and medications.  *If you need a refill on your cardiac medications before your next appointment, please call your pharmacy*  Testing/Procedures:  Echo as scheduled.  Follow-Up: At [facility], you and your health needs are our priority.  As part of our continuing mission to provide you with exceptional heart care, we have created designated Provider Care Teams.  These Care Teams include your primary Cardiologist (physician) and Advanced Practice Providers (APPs -  Physician Assistants and Nurse Practitioners) who all work together to provide you with the care you need, when you need it.  We recommend signing up for the patient portal called "MyChart".  Sign up information is provided on this After Visit Summary.  MyChart is used to connect with patients for Virtual Visits (Telemedicine).  Patients are able to view lab/test results, encounter notes, upcoming appointments, etc.  Non-urgent messages can be sent to your provider as well.   To learn more about what you can do with MyChart, go to [email or web address].    Your next appointment:   6 month(s)  The format for your next appointment:   In Person  Provider:   Candee Furbish, MD   Thank you for choosing [facility]!!

## 2020-04-16 IMAGING — MR MR KNEE*R* W/O CM
6 series · 37 of 40 positions shown · non-contrast
Comparison: Radiographs 11/26/2018

CLINICAL DATA: Medial knee pain and swelling.  No specific injury.

EXAM:
MRI OF THE RIGHT KNEE WITHOUT CONTRAST
TECHNIQUE: Multiplanar, multisequence MR imaging of the knee was performed. No
intravenous contrast was administered.

[Series 6: T2 fat-sat · axial · right · 4.0mm · 0.50mm/px · z∈[-75,+79]mm · 7 of 36 slices shown (1 of 3)]
[im 1/36]
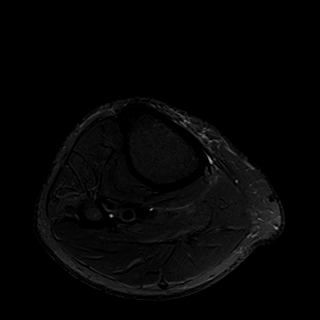
[im 6/36]
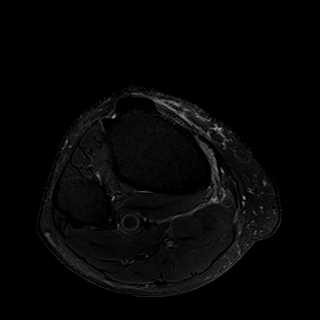
[im 12/36]
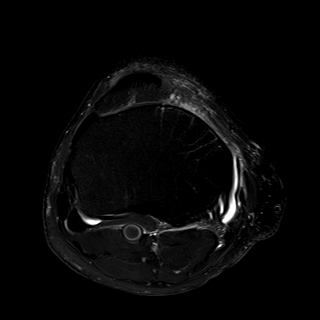
[im 18/36]
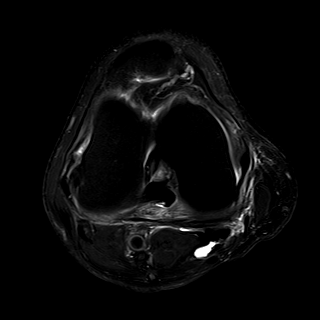
[im 24/36]
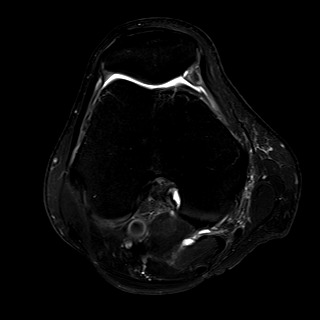
[im 30/36]
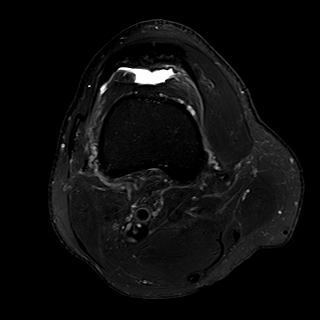
[im 36/36]
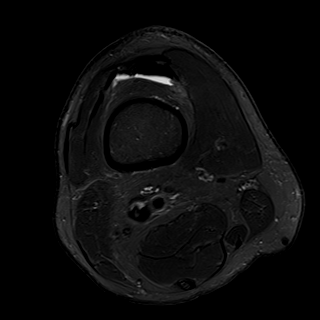

[Series 7: T2 fat-sat · coronal · right · 4.0mm · 0.39mm/px · 6 of 28 slices shown (2 of 3)]
[im 1/28]
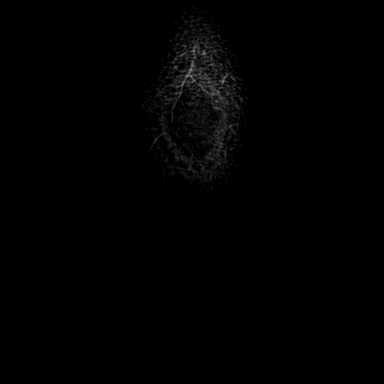
[im 6/28]
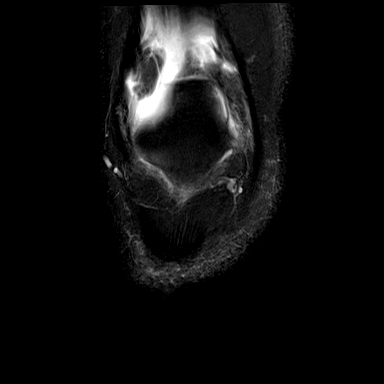
[im 11/28]
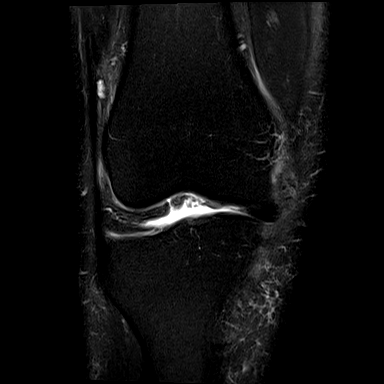
[im 17/28]
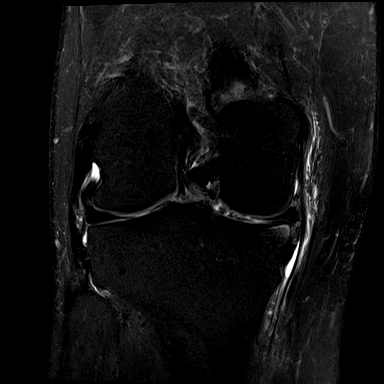
[im 22/28]
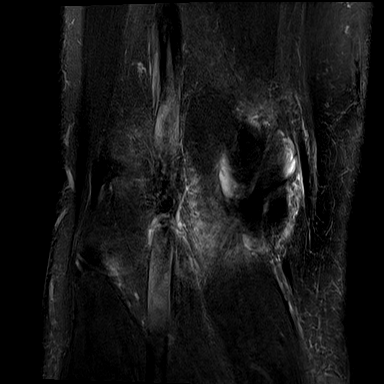
[im 28/28]
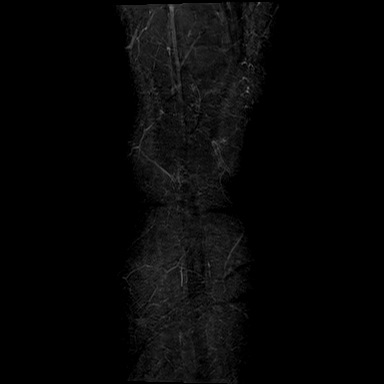

[Series 8: T1 · coronal · right · 4.0mm · 0.39mm/px · 3 of 28 slices shown]
[im 1/28]
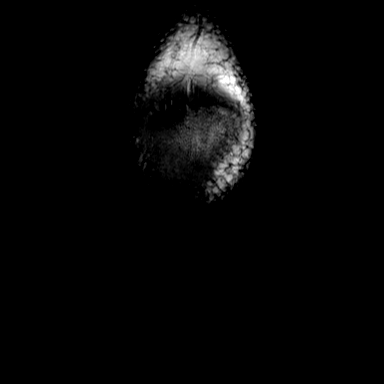
[im 6/28]
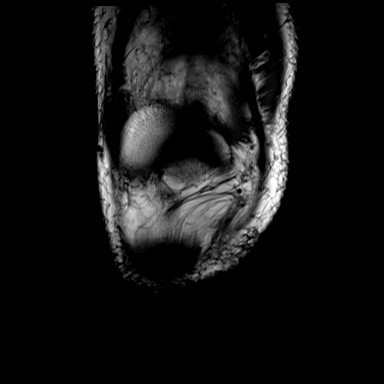
[im 11/28]
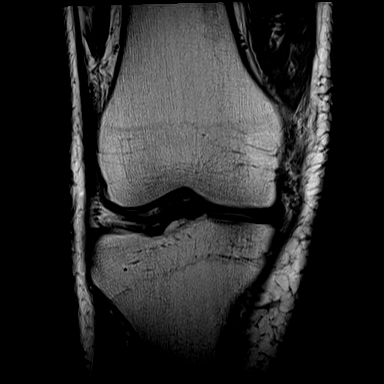

[Series 9: PD fat-sat · coronal · right · 3.0mm · 0.47mm/px · 7 of 32 slices shown (1 of 2)]
[im 1/32]
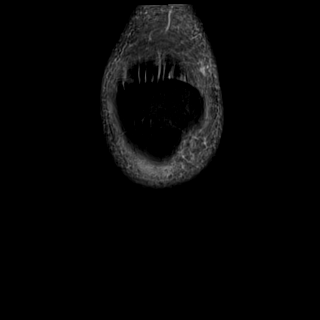
[im 6/32]
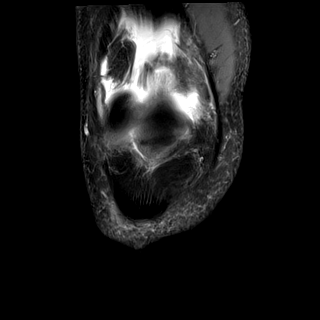
[im 11/32]
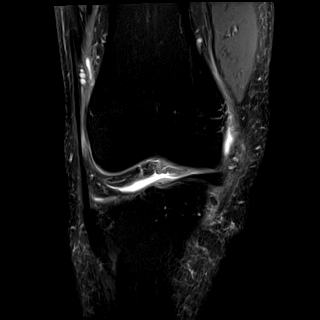
[im 16/32]
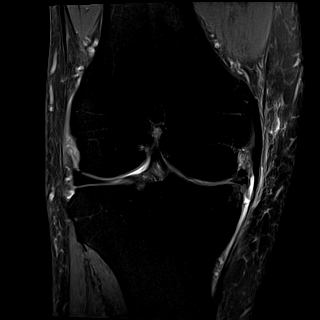
[im 21/32]
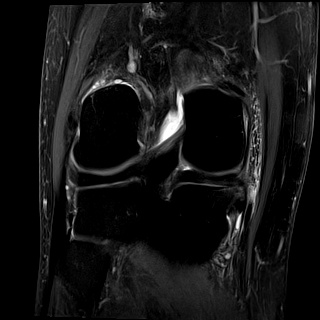
[im 26/32]
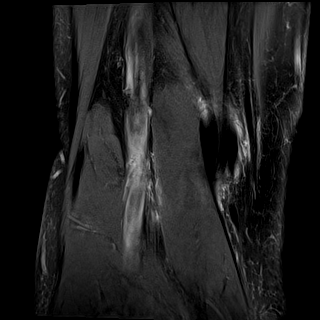
[im 32/32]
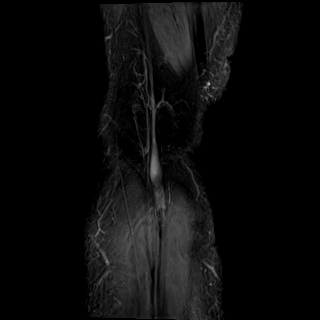

[Series 11: T2 fat-sat · sagittal · right · 3.0mm · 0.39mm/px · 7 of 32 slices shown (3 of 3)]
[im 1/32]
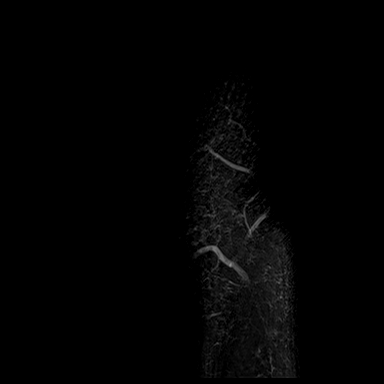
[im 6/32]
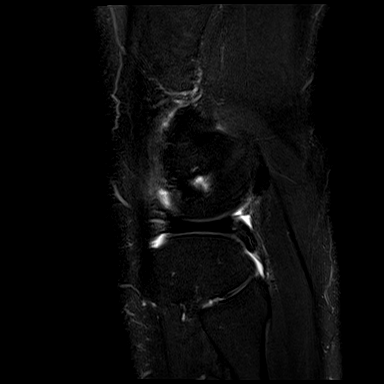
[im 11/32]
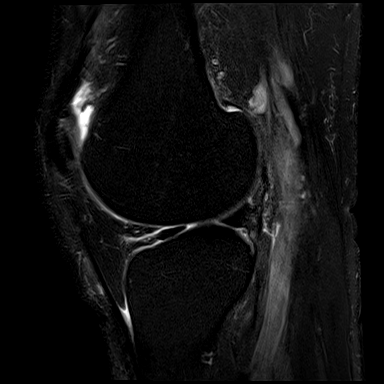
[im 16/32]
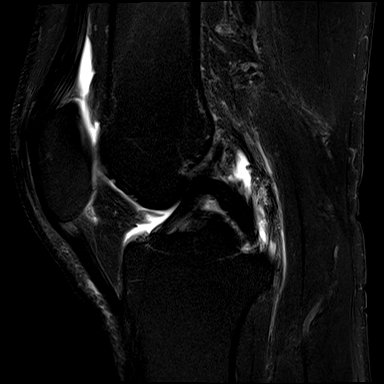
[im 21/32]
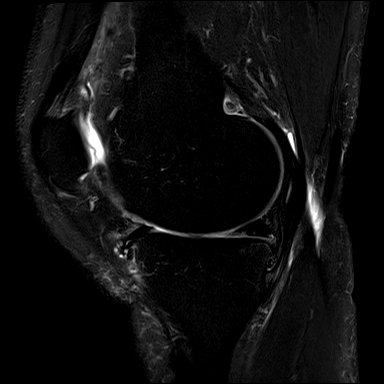
[im 26/32]
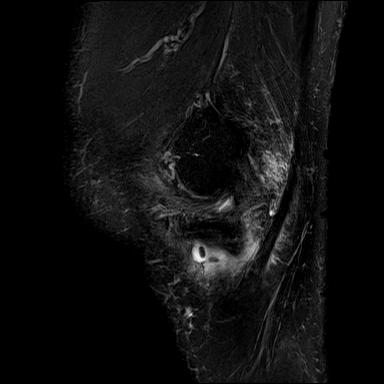
[im 32/32]
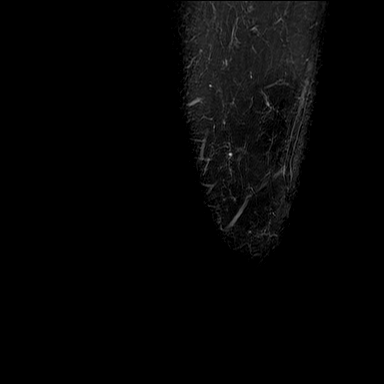

[Series 12: PD fat-sat · sagittal · right · 3.0mm · 0.39mm/px · 7 of 32 slices shown (2 of 2)]
[im 1/32]
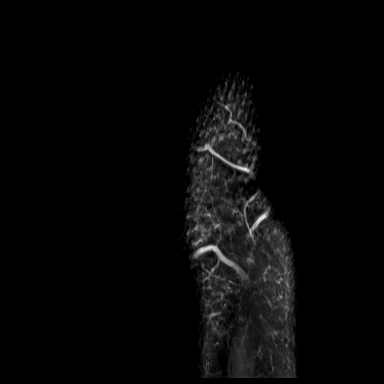
[im 6/32]
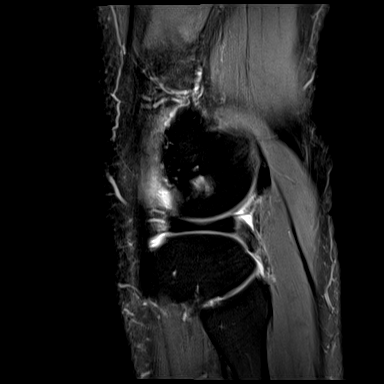
[im 11/32]
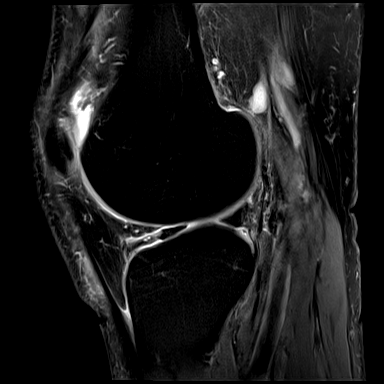
[im 16/32]
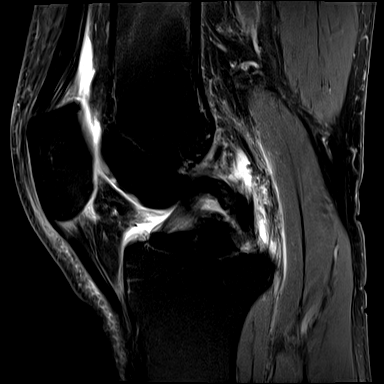
[im 21/32]
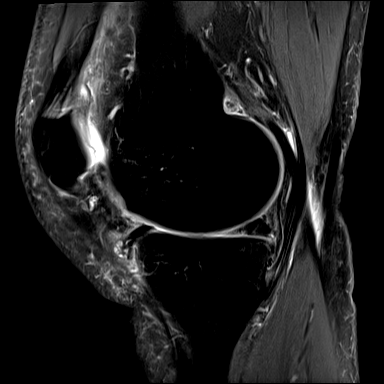
[im 26/32]
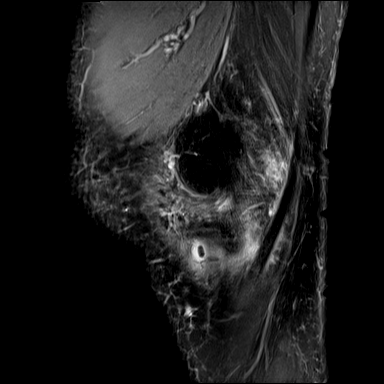
[im 32/32]
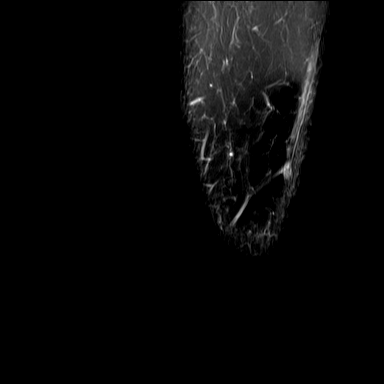

[37 of 40 positions shown; findings below may reference images not displayed]

FINDINGS: MENISCI

Medial meniscus: Inferior articular surface tear involving the
posterior horn midbody junction region with a flipped meniscal
fragment in the medial gutter. There is also a small adjacent
ossified loose body in the medial gutter surrounded by fluid.

Lateral meniscus:  Intact

LIGAMENTS

Cruciates:  Intact

Collaterals:  Intact.  Mild MCL and pes anserine bursitis.

CARTILAGE

Patellofemoral:  Mild to moderate degenerative chondrosis.

Medial: Moderate to advanced degenerative chondrosis with areas of
full or near full-thickness cartilage loss, mild joint space
narrowing and early spurring.

Lateral: Mild to moderate degenerative chondrosis with cartilage
thinning but no defects.

Joint:  Small joint effusion.  Medial patellar plica is noted.

Popliteal Fossa:  Small Baker's cyst.

Extensor Mechanism: The patella retinacular structures are intact
and the quadriceps and patellar tendons are intact. Moderate
proximal patellar tendinopathy.

Bones:  No acute bony findings.

Other: Normal knee musculature.
IMPRESSION: 1. Medial meniscus tear as detailed above.
2. Intact ligamentous structures and no acute bony findings.
3. Tricompartmental degenerative changes, most significant in the
medial compartment.
4. Small joint effusion and small Baker's cyst.
5. Mild/moderate proximal patellar tendinopathy.

## 2020-04-29 ENCOUNTER — Other Ambulatory Visit: Payer: Self-pay | Admitting: Family Medicine

## 2020-04-29 ENCOUNTER — Other Ambulatory Visit: Payer: Self-pay

## 2020-04-29 ENCOUNTER — Ambulatory Visit (HOSPITAL_COMMUNITY): Payer: Medicare Other | Attending: Cardiovascular Disease

## 2020-04-29 DIAGNOSIS — I420 Dilated cardiomyopathy: Secondary | ICD-10-CM | POA: Diagnosis present

## 2020-04-29 DIAGNOSIS — I493 Ventricular premature depolarization: Secondary | ICD-10-CM | POA: Diagnosis present

## 2020-04-29 LAB — ECHOCARDIOGRAM COMPLETE
Area-P 1/2: 1.55 cm2
P 1/2 time: 656 ms
S' Lateral: 4.4 cm

## 2020-05-05 ENCOUNTER — Telehealth: Payer: Self-pay | Admitting: Internal Medicine

## 2020-05-05 NOTE — Telephone Encounter (Signed)
    Pt calling to get echo result

## 2020-05-05 NOTE — Telephone Encounter (Signed)
Returned call to pt and discussed his echocardiogram results. Pt wants to know if he needs to see Dr. Lovena Le again based upon these results.  He was made aware that Dr. Lovena Le and his RN Carlisle Beers wasn't in the office today and I would send them a message and Anderson Malta would call him back when she returns. Pt verbalized understanding.

## 2020-05-06 NOTE — Telephone Encounter (Signed)
Returned call to Pt.  Advised to follow up with [facility] PRN  Pt indicates understanding

## 2020-05-20 ENCOUNTER — Other Ambulatory Visit: Payer: Self-pay

## 2020-05-20 ENCOUNTER — Ambulatory Visit (INDEPENDENT_AMBULATORY_CARE_PROVIDER_SITE_OTHER): Payer: Medicare Other

## 2020-05-20 ENCOUNTER — Encounter: Payer: Self-pay | Admitting: Family Medicine

## 2020-05-20 ENCOUNTER — Encounter: Payer: Medicare Other | Admitting: Family Medicine

## 2020-05-20 ENCOUNTER — Ambulatory Visit (INDEPENDENT_AMBULATORY_CARE_PROVIDER_SITE_OTHER): Payer: Medicare Other | Admitting: Family Medicine

## 2020-05-20 VITALS — BP 118/62 | HR 58 | Temp 97.6°F | Wt 179.2 lb

## 2020-05-20 VITALS — BP 118/62 | HR 58 | Temp 97.6°F | Ht 71.0 in | Wt 179.2 lb

## 2020-05-20 DIAGNOSIS — Z0000 Encounter for general adult medical examination without abnormal findings: Secondary | ICD-10-CM

## 2020-05-20 NOTE — Patient Instructions (Signed)

## 2020-05-20 NOTE — Patient Instructions (Signed)
Mark Pruitt , Thank you for taking time to come for your Medicare Wellness Visit. I appreciate your ongoing commitment to your health goals. Please review the following plan we discussed and let me know if I can assist you in the future.   Screening recommendations/referrals: Colonoscopy: No longer required  Recommended yearly ophthalmology/optometry visit for glaucoma screening and checkup Recommended yearly dental visit for hygiene and checkup  Vaccinations: Influenza vaccine: up to date, next due fall 2021 Pneumococcal vaccine: Completed series Tdap vaccine: Up to date, next due 05/17/2029 Shingles vaccine: Currently due, please contact your pharmacy to discuss cost and to receive vaccine     Advanced directives: Copies on file   Conditions/risks identified: None   Next appointment: None   Preventive Care 53 Years and Older, Male Preventive care refers to lifestyle choices and visits with your health care provider that can promote health and wellness. What does preventive care include?  A yearly physical exam. This is also called an annual well check.  Dental exams once or twice a year.  Routine eye exams. Ask your health care provider how often you should have your eyes checked.  Personal lifestyle choices, including:  Daily care of your teeth and gums.  Regular physical activity.  Eating a healthy diet.  Avoiding tobacco and drug use.  Limiting alcohol use.  Practicing safe sex.  Taking low doses of aspirin every day.  Taking vitamin and mineral supplements as recommended by your health care provider. What happens during an annual well check? The services and screenings done by your health care provider during your annual well check will depend on your age, overall health, lifestyle risk factors, and family history of disease. Counseling  Your health care provider may ask you questions about your:  Alcohol use.  Tobacco use.  Drug use.  Emotional  well-being.  Home and relationship well-being.  Sexual activity.  Eating habits.  History of falls.  Memory and ability to understand (cognition).  Work and work Statistician. Screening  You may have the following tests or measurements:  Height, weight, and BMI.  Blood pressure.  Lipid and cholesterol levels. These may be checked every 5 years, or more frequently if you are over 30 years old.  Skin check.  Lung cancer screening. You may have this screening every year starting at age 78 if you have a 30-pack-year history of smoking and currently smoke or have quit within the past 15 years.  Fecal occult blood test (FOBT) of the stool. You may have this test every year starting at age 54.  Flexible sigmoidoscopy or colonoscopy. You may have a sigmoidoscopy every 5 years or a colonoscopy every 10 years starting at age 54.  Prostate cancer screening. Recommendations will vary depending on your family history and other risks.  Hepatitis C blood test.  Hepatitis B blood test.  Sexually transmitted disease (STD) testing.  Diabetes screening. This is done by checking your blood sugar (glucose) after you have not eaten for a while (fasting). You may have this done every 1-3 years.  Abdominal aortic aneurysm (AAA) screening. You may need this if you are a current or former smoker.  Osteoporosis. You may be screened starting at age 64 if you are at high risk. Talk with your health care provider about your test results, treatment options, and if necessary, the need for more tests. Vaccines  Your health care provider may recommend certain vaccines, such as:  Influenza vaccine. This is recommended every year.  Tetanus, diphtheria,  and acellular pertussis (Tdap, Td) vaccine. You may need a Td booster every 10 years.  Zoster vaccine. You may need this after age 50.  Pneumococcal 13-valent conjugate (PCV13) vaccine. One dose is recommended after age 14.  Pneumococcal  polysaccharide (PPSV23) vaccine. One dose is recommended after age 1. Talk to your health care provider about which screenings and vaccines you need and how often you need them. This information is not intended to replace advice given to you by your health care provider. Make sure you discuss any questions you have with your health care provider. Document Released: 10/14/2015 Document Revised: 06/06/2016 Document Reviewed: 07/19/2015 Elsevier Interactive Patient Education  2017 [place] Prevention in the Home Falls can cause injuries. They can happen to people of all ages. There are many things you can do to make your home safe and to help prevent falls. What can I do on the outside of my home?  Regularly fix the edges of walkways and driveways and fix any cracks.  Remove anything that might make you trip as you walk through a door, such as a raised step or threshold.  Trim any bushes or trees on the path to your home.  Use bright outdoor lighting.  Clear any walking paths of anything that might make someone trip, such as rocks or tools.  Regularly check to see if handrails are loose or broken. Make sure that both sides of any steps have handrails.  Any raised decks and porches should have guardrails on the edges.  Have any leaves, snow, or ice cleared regularly.  Use sand or salt on walking paths during winter.  Clean up any spills in your garage right away. This includes oil or grease spills. What can I do in the bathroom?  Use night lights.  Install grab bars by the toilet and in the tub and shower. Do not use towel bars as grab bars.  Use non-skid mats or decals in the tub or shower.  If you need to sit down in the shower, use a plastic, non-slip stool.  Keep the floor dry. Clean up any water that spills on the floor as soon as it happens.  Remove soap buildup in the tub or shower regularly.  Attach bath mats securely with double-sided non-slip rug  tape.  Do not have throw rugs and other things on the floor that can make you trip. What can I do in the bedroom?  Use night lights.  Make sure that you have a light by your bed that is easy to reach.  Do not use any sheets or blankets that are too big for your bed. They should not hang down onto the floor.  Have a firm chair that has side arms. You can use this for support while you get dressed.  Do not have throw rugs and other things on the floor that can make you trip. What can I do in the kitchen?  Clean up any spills right away.  Avoid walking on wet floors.  Keep items that you use a lot in easy-to-reach places.  If you need to reach something above you, use a strong step stool that has a grab bar.  Keep electrical cords out of the way.  Do not use floor polish or wax that makes floors slippery. If you must use wax, use non-skid floor wax.  Do not have throw rugs and other things on the floor that can make you trip. What can I do with my  stairs?  Do not leave any items on the stairs.  Make sure that there are handrails on both sides of the stairs and use them. Fix handrails that are broken or loose. Make sure that handrails are as long as the stairways.  Check any carpeting to make sure that it is firmly attached to the stairs. Fix any carpet that is loose or worn.  Avoid having throw rugs at the top or bottom of the stairs. If you do have throw rugs, attach them to the floor with carpet tape.  Make sure that you have a light switch at the top of the stairs and the bottom of the stairs. If you do not have them, ask someone to add them for you. What else can I do to help prevent falls?  Wear shoes that:  Do not have high heels.  Have rubber bottoms.  Are comfortable and fit you well.  Are closed at the toe. Do not wear sandals.  If you use a stepladder:  Make sure that it is fully opened. Do not climb a closed stepladder.  Make sure that both sides of the  stepladder are locked into place.  Ask someone to hold it for you, if possible.  Clearly mark and make sure that you can see:  Any grab bars or handrails.  First and last steps.  Where the edge of each step is.  Use tools that help you move around (mobility aids) if they are needed. These include:  Canes.  Walkers.  Scooters.  Crutches.  Turn on the lights when you go into a dark area. Replace any light bulbs as soon as they burn out.  Set up your furniture so you have a clear path. Avoid moving your furniture around.  If any of your floors are uneven, fix them.  If there are any pets around you, be aware of where they are.  Review your medicines with your doctor. Some medicines can make you feel dizzy. This can increase your chance of falling. Ask your doctor what other things that you can do to help prevent falls. This information is not intended to replace advice given to you by your health care provider. Make sure you discuss any questions you have with your health care provider. Document Released: 07/14/2009 Document Revised: 02/23/2016 Document Reviewed: 10/22/2014 Elsevier Interactive Patient Education  2017 [employer].

## 2020-05-20 NOTE — Progress Notes (Signed)
Subjective:   Mark Pruitt is a 79 y.o. male who presents for Medicare Annual/Subsequent preventive examination.  Review of Systems    N/A Cardiac Risk Factors include: advanced age (>74men, >16 women);male gender     Objective:    Today's Vitals   05/20/20 1115  BP: 118/62  Pulse: (!) 58  Temp: 97.6 F (36.4 C)  TempSrc: Oral  SpO2: 98%  Weight: 179 lb 3.2 oz (81.3 kg)  Height: 5\' 11"  (1.803 m)   Body mass index is 24.99 kg/m.  Advanced Directives 05/20/2020 05/20/2020 01/13/2020 04/15/2016 09/06/2014  Does Patient Have a Medical Advance Directive? Yes Yes Yes No Yes  Type of Paramedic of [place];Living will [place];Living will [place];Living will - [place]  Does patient want to make changes to medical advance directive? No - Patient declined No - Patient declined No - Patient declined - -  Copy of Register in Chart? Yes - validated most recent copy scanned in chart (See row information) Yes - validated most recent copy scanned in chart (See row information) No - copy requested - -  Would patient like information on creating a medical advance directive? - - - No - patient declined information -    Current Medications (verified) Outpatient Encounter Medications as of 05/20/2020  Medication Sig  . aspirin EC 81 MG tablet Take 81 mg by mouth daily.  Marland Kitchen Bioflavonoid Products (ESTER-C) TABS Take 1 tablet by mouth daily.  . cholecalciferol (VITAMIN D) 25 MCG (1000 UNIT) tablet Take 1,000 Units by mouth daily.  Marland Kitchen lisinopril (ZESTRIL) 5 MG tablet TAKE 1 TABLET BY MOUTH  DAILY  . metoprolol succinate (TOPROL-XL) 50 MG 24 hr tablet Take 1 tablet (50 mg total) by mouth daily. Take with or immediately following a meal.  . Multiple Vitamin (MULTIVITAMIN WITH MINERALS) TABS tablet Take 1 tablet by mouth daily.  . nabumetone (RELAFEN) 750 MG tablet Take 1 tablet (750 mg total)  by mouth 2 (two) times daily as needed.  . Omega-3 Fatty Acids (FISH OIL) 1000 MG CAPS Take 1,000 mg by mouth daily.    No facility-administered encounter medications on file as of 05/20/2020.    Allergies (verified) Patient has no known allergies.   History: Past Medical History:  Diagnosis Date  . Cancer [facility])    prostate cancer  . Cataract   . COLONIC POLYPS 07/25/2009  . ELEVATED PROSTATE SPECIFIC ANTIGEN 07/27/2009  . Hearing loss of both ears   . HYPERTROPHY PROSTATE W/UR OBST & OTH LUTS 07/25/2009  . Irregular heart beat   . LICHEN SIMPLEX CHRONICUS 02/03/2009  . Other primary cardiomyopathies 08/08/2010   Past Surgical History:  Procedure Laterality Date  . COLONOSCOPY    . LEFT HEART CATH AND CORONARY ANGIOGRAPHY N/A 01/13/2020   Procedure: LEFT HEART CATH AND CORONARY ANGIOGRAPHY;  Surgeon: Jettie Booze, MD;  Location: [facility];  Service: Cardiovascular;  Laterality: N/A;  . POLYPECTOMY    . PROSTATE SURGERY     Family History  Problem Relation Age of Onset  . Cancer Mother        parathyroid adenoma  . Anuerysm Father 43       abdominal  . Arthritis Neg Hx        family hx  . Colon cancer Neg Hx   . Esophageal cancer Neg Hx   . Rectal cancer Neg Hx   . Stomach cancer Neg  Hx    Social History   Socioeconomic History  . Marital status: Married    Spouse name: Not on file  . Number of children: Not on file  . Years of education: Not on file  . Highest education level: Not on file  Occupational History  . Not on file  Tobacco Use  . Smoking status: Former Smoker    Packs/day: 1.00    Years: 20.00    Pack years: 20.00    Types: Cigarettes    Quit date: 10/22/1983    Years since quitting: 36.6  . Smokeless tobacco: Never Used  Vaping Use  . Vaping Use: Never used  Substance and Sexual Activity  . Alcohol use: No    Alcohol/week: 0.0 standard drinks  . Drug use: No  . Sexual activity: Not on file  Other Topics Concern  . Not on  file  Social History Narrative   From [place] originally    Social Determinants of Health   Financial Resource Strain: Low Risk   . Difficulty of Paying Living Expenses: Not hard at all  Food Insecurity: No Food Insecurity  . Worried About Charity fundraiser in the Last Year: Never true  . Ran Out of Food in the Last Year: Never true  Transportation Needs: No Transportation Needs  . Lack of Transportation (Medical): No  . Lack of Transportation (Non-Medical): No  Physical Activity: Insufficiently Active  . Days of Exercise per Week: 2 days  . Minutes of Exercise per Session: 60 min  Stress: No Stress Concern Present  . Feeling of Stress : Not at all  Social Connections: Moderately Integrated  . Frequency of Communication with Friends and Family: More than three times a week  . Frequency of Social Gatherings with Friends and Family: More than three times a week  . Attends Religious Services: More than 4 times per year  . Active Member of Clubs or Organizations: No  . Attends Archivist Meetings: Never  . Marital Status: Married    Tobacco Counseling Counseling given: Not Answered   Clinical Intake:  Pre-visit preparation completed: Yes  Pain : No/denies pain     Nutritional Risks: None Diabetes: No  How often do you need to have someone help you when you read instructions, pamphlets, or other written materials from your doctor or pharmacy?: 1 - Never What is the last grade level you completed in school?: Bachelors Degree  Diabetic?No  Interpreter Needed?: No  Information entered by :: [place] of Daily Living In your present state of health, do you have any difficulty performing the following activities: 05/20/2020  Hearing? Y  Comment Has hearing aids in both ears  Vision? N  Difficulty concentrating or making decisions? N  Walking or climbing stairs? N  Dressing or bathing? N  Doing errands, shopping? N  Preparing Food and  eating ? N  Using the Toilet? N  In the past six months, have you accidently leaked urine? N  Do you have problems with loss of bowel control? N  Managing your Medications? N  Managing your Finances? N  Some recent data might be hidden    Patient Care Team: Eulas Post, MD as PCP - General Jerline Pain, MD as PCP - Cardiology (Cardiology)  Indicate any recent Medical Services you may have received from other than [facility] providers in the past year (date may be approximate).     Assessment:   This is a routine  wellness examination for Gwyndolyn Saxon.  Hearing/Vision screen  Hearing Screening   125Hz  250Hz  500Hz  1000Hz  2000Hz  3000Hz  4000Hz  6000Hz  8000Hz   Right ear:           Left ear:           Vision Screening Comments: Patient states gets eye examined yearly    Dietary issues and exercise activities discussed: Current Exercise Habits: Home exercise routine, Time (Minutes): 60, Frequency (Times/Week): 2, Weekly Exercise (Minutes/Week): 120, Intensity: Mild  Goals    . Patient Stated     I will continue to play golf       Depression Screen PHQ 2/9 Scores 05/20/2020 05/20/2020 05/18/2019 03/31/2018 03/27/2017 01/03/2016 02/21/2015  PHQ - 2 Score 0 0 0 0 0 0 0  PHQ- 9 Score 0 - - - - - -    Fall Risk Fall Risk  05/20/2020 05/20/2020 05/18/2019 03/31/2018 03/27/2017  Falls in the past year? 0 0 0 Yes No  Number falls in past yr: 0 0 0 1 -  Injury with Fall? 0 0 0 No -  Risk for fall due to : Medication side effect - - - -  Follow up Falls evaluation completed;Falls prevention discussed Falls evaluation completed Falls evaluation completed - -    Any stairs in or around the home? Yes  If so, are there any without handrails? No  Home free of loose throw rugs in walkways, pet beds, electrical cords, etc? Yes  Adequate lighting in your home to reduce risk of falls? Yes   ASSISTIVE DEVICES UTILIZED TO PREVENT FALLS:  Life alert? No  Use of a cane, walker or w/c? No  Grab bars in the  bathroom? Yes  Shower chair or bench in shower? Yes  Elevated toilet seat or a handicapped toilet? No   TIMED UP AND GO:  Was the test performed? Yes .  Length of time to ambulate 10 feet: 5 sec.   Gait steady and fast without use of assistive device  Cognitive Function:     6CIT Screen 05/20/2020  What Year? 0 points  What month? 0 points  What time? 0 points  Count back from 20 0 points  Months in reverse 0 points  Repeat phrase 0 points  Total Score 0    Immunizations Immunization History  Administered Date(s) Administered  . Fluad Quad(high Dose 65+) 06/10/2019  . Hepatitis A 07/25/2009  . Influenza Split 09/05/2011, 08/04/2012  . Influenza Whole 07/25/2009, 07/27/2010  . Influenza, High Dose Seasonal PF 07/06/2015, 07/30/2017, 07/15/2018  . Influenza,inj,Quad PF,6+ Mos 06/15/2013, 05/19/2014  . Influenza-Unspecified 06/27/2016  . PFIZER SARS-COV-2 Vaccination 10/13/2019, 10/31/2019  . Pneumococcal Conjugate-13 07/06/2015  . Pneumococcal Polysaccharide-23 10/02/2007  . Td 10/02/2007  . Tdap 05/18/2019, 05/18/2019  . Zoster 05/01/2008    TDAP status: Up to date Flu Vaccine status: Up to date Pneumococcal vaccine status: Up to date Covid-19 vaccine status: Completed vaccines  Qualifies for Shingles Vaccine? Yes   Zostavax completed Yes   Shingrix Completed?: No.    Education has been provided regarding the importance of this vaccine. Patient has been advised to call insurance company to determine out of pocket expense if they have not yet received this vaccine. Advised may also receive vaccine at local pharmacy or Health Dept. Verbalized acceptance and understanding.  Screening Tests Health Maintenance  Topic Date Due  . INFLUENZA VACCINE  05/01/2020  . TETANUS/TDAP  05/17/2029  . COVID-19 Vaccine  Completed  . PNA vac Low Risk Adult  Completed  Health Maintenance  Health Maintenance Due  Topic Date Due  . INFLUENZA VACCINE  05/01/2020     Colorectal cancer screening: No longer required.   Lung Cancer Screening: (Low Dose CT Chest recommended if Age 81-80 years, 30 pack-year currently smoking OR have quit w/in 15years.) does not qualify.   Lung Cancer Screening Referral: N/A  Additional Screening:  Hepatitis C Screening: does not qualify;   Vision Screening: Recommended annual ophthalmology exams for early detection of glaucoma and other disorders of the eye. Is the patient up to date with their annual eye exam?  Yes  Who is the provider or what is the name of the office in which the patient attends annual eye exams? Dr. Bing Plume If pt is not established with a provider, would they like to be referred to a provider to establish care? No .   Dental Screening: Recommended annual dental exams for proper oral hygiene  Community Resource Referral / Chronic Care Management: CRR required this visit?  No   CCM required this visit?  No      Plan:     I have personally reviewed and noted the following in the patient's chart:   . Medical and social history . Use of alcohol, tobacco or illicit drugs  . Current medications and supplements . Functional ability and status . Nutritional status . Physical activity . Advanced directives . List of other physicians . Hospitalizations, surgeries, and ER visits in previous 12 months . Vitals . Screenings to include cognitive, depression, and falls . Referrals and appointments  In addition, I have reviewed and discussed with patient certain preventive protocols, quality metrics, and best practice recommendations. A written personalized care plan for preventive services as well as general preventive health recommendations were provided to patient.     Ofilia Neas, LPN   5/75/0518   Nurse Notes: None

## 2020-05-20 NOTE — Progress Notes (Signed)
Established Patient Office Visit  Subjective:  Patient ID: Mark Pruitt, male    DOB: 02/19/1941  Age: 79 y.o. MRN: [id number]  CC:  Chief Complaint  Patient presents with  . Annual Exam    HPI MARQUET FAIRCLOTH presents for Ms. Heying is seen for physical exam.  He and his wife will be looking at selling their house and moving to [place] soon.  They are excited about that.  He does require some labs prior to residing there including urinalysis, CBC, comprehensive metabolic panel, and QuantiFERON gold assay.  He is low risk for TB.  His chronic problems include history of colon adenomas, remote history of prostate cancer, history of PVCs, history of dilated cardiomyopathy (nonischemic).    He gets yearly flu vaccines.  He had tetanus 05/18/2019.  Pneumonia vaccines complete.  He has had Covid vaccine.  He has had previous Zostavax.  Declines Shingrix at this time.   Past Medical History:  Diagnosis Date  . Cancer [facility])    prostate cancer  . Cataract   . COLONIC POLYPS 07/25/2009  . ELEVATED PROSTATE SPECIFIC ANTIGEN 07/27/2009  . Hearing loss of both ears   . HYPERTROPHY PROSTATE W/UR OBST & OTH LUTS 07/25/2009  . Irregular heart beat   . LICHEN SIMPLEX CHRONICUS 02/03/2009  . Other primary cardiomyopathies 08/08/2010    Past Surgical History:  Procedure Laterality Date  . COLONOSCOPY    . LEFT HEART CATH AND CORONARY ANGIOGRAPHY N/A 01/13/2020   Procedure: LEFT HEART CATH AND CORONARY ANGIOGRAPHY;  Surgeon: Jettie Booze, MD;  Location: [place] [facility];  Service: Cardiovascular;  Laterality: N/A;  . POLYPECTOMY    . PROSTATE SURGERY      Family History  Problem Relation Age of Onset  . Cancer Mother        parathyroid adenoma  . Anuerysm Father 14       abdominal  . Arthritis Neg Hx        family hx  . Colon cancer Neg Hx   . Esophageal cancer Neg Hx   . Rectal cancer Neg Hx   . Stomach cancer Neg Hx     Social  History   Socioeconomic History  . Marital status: Married    Spouse name: Not on file  . Number of children: Not on file  . Years of education: Not on file  . Highest education level: Not on file  Occupational History  . Not on file  Tobacco Use  . Smoking status: Former Smoker    Packs/day: 1.00    Years: 20.00    Pack years: 20.00    Types: Cigarettes    Quit date: 10/22/1983    Years since quitting: 36.6  . Smokeless tobacco: Never Used  Vaping Use  . Vaping Use: Never used  Substance and Sexual Activity  . Alcohol use: No    Alcohol/week: 0.0 standard drinks  . Drug use: No  . Sexual activity: Not on file  Other Topics Concern  . Not on file  Social History Narrative   From [place] originally    Social Determinants of Health   Financial Resource Strain: Low Risk   . Difficulty of Paying Living Expenses: Not hard at all  Food Insecurity: No Food Insecurity  . Worried About Charity fundraiser in the Last Year: Never true  . Ran Out of Food in the Last Year: Never true  Transportation Needs: No Transportation Needs  .  Lack of Transportation (Medical): No  . Lack of Transportation (Non-Medical): No  Physical Activity: Insufficiently Active  . Days of Exercise per Week: 2 days  . Minutes of Exercise per Session: 60 min  Stress: No Stress Concern Present  . Feeling of Stress : Not at all  Social Connections: Moderately Integrated  . Frequency of Communication with Friends and Family: More than three times a week  . Frequency of Social Gatherings with Friends and Family: More than three times a week  . Attends Religious Services: More than 4 times per year  . Active Member of Clubs or Organizations: No  . Attends Archivist Meetings: Never  . Marital Status: Married  Human resources officer Violence: Not At Risk  . Fear of Current or Ex-Partner: No  . Emotionally Abused: No  . Physically Abused: No  . Sexually Abused: No    Outpatient Medications Prior  to Visit  Medication Sig Dispense Refill  . aspirin EC 81 MG tablet Take 81 mg by mouth daily.    Marland Kitchen Bioflavonoid Products (ESTER-C) TABS Take 1 tablet by mouth daily.    . cholecalciferol (VITAMIN D) 25 MCG (1000 UNIT) tablet Take 1,000 Units by mouth daily.    Marland Kitchen lisinopril (ZESTRIL) 5 MG tablet TAKE 1 TABLET BY MOUTH  DAILY 90 tablet 3  . metoprolol succinate (TOPROL-XL) 50 MG 24 hr tablet Take 1 tablet (50 mg total) by mouth daily. Take with or immediately following a meal. 90 tablet 3  . Multiple Vitamin (MULTIVITAMIN WITH MINERALS) TABS tablet Take 1 tablet by mouth daily.    . nabumetone (RELAFEN) 750 MG tablet Take 1 tablet (750 mg total) by mouth 2 (two) times daily as needed. 60 tablet 6  . Omega-3 Fatty Acids (FISH OIL) 1000 MG CAPS Take 1,000 mg by mouth daily.      No facility-administered medications prior to visit.    No Known Allergies  ROS Review of Systems  Constitutional: Negative for activity change, appetite change, fatigue and fever.  HENT: Negative for congestion, ear pain and trouble swallowing.   Eyes: Negative for pain and visual disturbance.  Respiratory: Negative for cough, shortness of breath and wheezing.   Cardiovascular: Negative for chest pain and palpitations.  Gastrointestinal: Negative for abdominal distention, abdominal pain, blood in stool, constipation, diarrhea, nausea, rectal pain and vomiting.  Genitourinary: Negative for dysuria, hematuria and testicular pain.  Musculoskeletal: Negative for arthralgias and joint swelling.  Skin: Negative for rash.  Neurological: Negative for dizziness, syncope and headaches.  Hematological: Negative for adenopathy.  Psychiatric/Behavioral: Negative for confusion and dysphoric mood.      Objective:    Physical Exam Constitutional:      General: He is not in acute distress.    Appearance: He is well-developed.  HENT:     Head: Normocephalic and atraumatic.     Right Ear: External ear normal.     Left  Ear: External ear normal.  Eyes:     Conjunctiva/sclera: Conjunctivae normal.     Pupils: Pupils are equal, round, and reactive to light.  Neck:     Thyroid: No thyromegaly.  Cardiovascular:     Rate and Rhythm: Normal rate and regular rhythm.     Heart sounds: Normal heart sounds. No murmur heard.   Pulmonary:     Effort: No respiratory distress.     Breath sounds: No wheezing or rales.  Abdominal:     General: Bowel sounds are normal. There is no distension.  Palpations: Abdomen is soft. There is no mass.     Tenderness: There is no abdominal tenderness. There is no guarding or rebound.  Musculoskeletal:     Cervical back: Normal range of motion and neck supple.     Right lower leg: No edema.     Left lower leg: No edema.  Lymphadenopathy:     Cervical: No cervical adenopathy.  Skin:    Findings: No rash.  Neurological:     Mental Status: He is alert and oriented to person, place, and time.     Cranial Nerves: No cranial nerve deficit.     Deep Tendon Reflexes: Reflexes normal.     BP 118/62 (BP Location: Left Arm, Patient Position: Sitting, Cuff Size: Normal)   Pulse (!) 58   Temp 97.6 F (36.4 C) (Oral)   Wt 179 lb 3.2 oz (81.3 kg)   SpO2 98%   BMI 24.99 kg/m  Wt Readings from Last 3 Encounters:  05/20/20 179 lb 3.2 oz (81.3 kg)  05/20/20 179 lb 3.2 oz (81.3 kg)  04/11/20 183 lb (83 kg)     Health Maintenance Due  Topic Date Due  . INFLUENZA VACCINE  05/01/2020    There are no preventive care reminders to display for this patient.  Lab Results  Component Value Date   TSH 1.84 05/18/2019   Lab Results  Component Value Date   WBC 7.0 01/06/2020   HGB 15.0 01/06/2020   HCT 44.5 01/06/2020   MCV 90 01/06/2020   PLT 185 01/06/2020   Lab Results  Component Value Date   NA 137 01/06/2020   K 4.6 01/06/2020   CO2 27 01/06/2020   GLUCOSE 85 01/06/2020   BUN 17 01/06/2020   CREATININE 0.94 01/06/2020   BILITOT 1.0 05/18/2019   ALKPHOS 48  05/18/2019   AST 16 05/18/2019   ALT 13 05/18/2019   PROT 7.2 05/18/2019   ALBUMIN 4.5 05/18/2019   CALCIUM 9.6 01/06/2020   ANIONGAP 5 04/17/2016   GFR 65.83 05/18/2019   Lab Results  Component Value Date   CHOL 186 05/18/2019   Lab Results  Component Value Date   HDL 64.30 05/18/2019   Lab Results  Component Value Date   LDLCALC 105 (H) 05/18/2019   Lab Results  Component Value Date   TRIG 82.0 05/18/2019   Lab Results  Component Value Date   CHOLHDL 3 05/18/2019   No results found for: HGBA1C    Assessment & Plan:   Problem List Items Addressed This Visit    None    Visit Diagnoses    Physical exam    -  Primary   Relevant Orders   CBC with Differential/Platelet   Lipid panel   TSH   CMP   Urinalysis   QuantiFERON-TB Gold Plus    Generally healthy 79 year old male.  -Recommend annual flu vaccine and he plans to get this around September -We discussed Shingrix vaccine but he declines at this time -Obtain lab work including CBC, lipid panel, comprehensive metabolic panel, urinalysis, QuantiFERON gold assay -Forms will be completed for [facility]  No orders of the defined types were placed in this encounter.   Follow-up: No follow-ups on file.    Carolann Littler, MD

## 2020-05-22 LAB — COMPREHENSIVE METABOLIC PANEL WITH GFR
AG Ratio: 1.6 (calc) (ref 1.0–2.5)
ALT: 11 U/L (ref 9–46)
AST: 14 U/L (ref 10–35)
Albumin: 4.4 g/dL (ref 3.6–5.1)
Alkaline phosphatase (APISO): 52 U/L (ref 35–144)
BUN: 18 mg/dL (ref 7–25)
CO2: 27 mmol/L (ref 20–32)
Calcium: 9.7 mg/dL (ref 8.6–10.3)
Chloride: 103 mmol/L (ref 98–110)
Creat: 0.94 mg/dL (ref 0.70–1.11)
Globulin: 2.8 g/dL (ref 1.9–3.7)
Glucose, Bld: 103 mg/dL — ABNORMAL HIGH (ref 65–99)
Potassium: 5.3 mmol/L (ref 3.5–5.3)
Sodium: 137 mmol/L (ref 135–146)
Total Bilirubin: 1.4 mg/dL — ABNORMAL HIGH (ref 0.2–1.2)
Total Protein: 7.2 g/dL (ref 6.1–8.1)

## 2020-05-22 LAB — LIPID PANEL
Cholesterol: 160 mg/dL (ref <200)
HDL: 57 mg/dL (ref >=40)
LDL Cholesterol (Calc): 86 mg/dL
Non-HDL Cholesterol (Calc): 103 mg/dL (ref <130)
Total CHOL/HDL Ratio: 2.8 (calc) (ref <5.0)
Triglycerides: 79 mg/dL (ref <150)

## 2020-05-22 LAB — CBC WITH DIFFERENTIAL/PLATELET
Absolute Monocytes: 634 {cells}/uL (ref 200–950)
Basophils Absolute: 40 {cells}/uL (ref 0–200)
Basophils Relative: 0.6 %
Eosinophils Absolute: 33 {cells}/uL (ref 15–500)
Eosinophils Relative: 0.5 %
HCT: 46.1 % (ref 38.5–50.0)
Hemoglobin: 15.6 g/dL (ref 13.2–17.1)
Lymphs Abs: 1558 {cells}/uL (ref 850–3900)
MCH: 30.6 pg (ref 27.0–33.0)
MCHC: 33.8 g/dL (ref 32.0–36.0)
MCV: 90.4 fL (ref 80.0–100.0)
MPV: 12.2 fL (ref 7.5–12.5)
Monocytes Relative: 9.6 %
Neutro Abs: 4336 {cells}/uL (ref 1500–7800)
Neutrophils Relative %: 65.7 %
Platelets: 169 Thousand/uL (ref 140–400)
RBC: 5.1 Million/uL (ref 4.20–5.80)
RDW: 12.6 % (ref 11.0–15.0)
Total Lymphocyte: 23.6 %
WBC: 6.6 Thousand/uL (ref 3.8–10.8)

## 2020-05-22 LAB — QUANTIFERON-TB GOLD PLUS
Mitogen-NIL: 9.76 [IU]/mL
NIL: 0.02 [IU]/mL
QuantiFERON-TB Gold Plus: NEGATIVE
TB1-NIL: 0 [IU]/mL
TB2-NIL: 0 [IU]/mL

## 2020-05-22 LAB — URINALYSIS
Bilirubin Urine: NEGATIVE
Glucose, UA: NEGATIVE
Hgb urine dipstick: NEGATIVE
Ketones, ur: NEGATIVE
Leukocytes,Ua: NEGATIVE
Nitrite: NEGATIVE
Protein, ur: NEGATIVE
Specific Gravity, Urine: 1.018 (ref 1.001–1.03)
pH: <=5 (ref 5.0–8.0)

## 2020-05-22 LAB — TSH: TSH: 1.76 m[IU]/L (ref 0.40–4.50)

## 2020-05-23 DIAGNOSIS — H43813 Vitreous degeneration, bilateral: Secondary | ICD-10-CM | POA: Diagnosis not present

## 2020-05-23 DIAGNOSIS — H10413 Chronic giant papillary conjunctivitis, bilateral: Secondary | ICD-10-CM | POA: Diagnosis not present

## 2020-05-25 ENCOUNTER — Encounter: Payer: Self-pay | Admitting: Family Medicine

## 2020-07-16 ENCOUNTER — Ambulatory Visit: Payer: Medicare Other

## 2020-08-17 DIAGNOSIS — R350 Frequency of micturition: Secondary | ICD-10-CM | POA: Diagnosis not present

## 2020-10-12 ENCOUNTER — Encounter: Payer: Self-pay | Admitting: Cardiology

## 2020-10-12 ENCOUNTER — Other Ambulatory Visit: Payer: Self-pay

## 2020-10-12 ENCOUNTER — Ambulatory Visit: Payer: Medicare Other | Admitting: Cardiology

## 2020-10-12 VITALS — BP 80/40 | HR 64 | Ht 71.0 in | Wt 191.0 lb

## 2020-10-12 DIAGNOSIS — I420 Dilated cardiomyopathy: Secondary | ICD-10-CM

## 2020-10-12 DIAGNOSIS — I493 Ventricular premature depolarization: Secondary | ICD-10-CM | POA: Diagnosis not present

## 2020-10-12 MED ORDER — METOPROLOL SUCCINATE ER 25 MG PO TB24: 25.0000 mg | ORAL_TABLET | Freq: Every day | ORAL | 3 refills | Status: DC

## 2020-10-12 MED ORDER — LISINOPRIL 2.5 MG PO TABS: 2.5000 mg | ORAL_TABLET | Freq: Every day | ORAL | 3 refills | Status: DC

## 2020-10-12 NOTE — Patient Instructions (Signed)
Medication Instructions:  Please decrease your Metoprolol to 25 mg a day. Decrease your Lisinopril to 2.5 mg a day. Continue all other medications as listed.  *If you need a refill on your cardiac medications before your next appointment, please call your pharmacy*  Follow-Up: At [facility], you and your health needs are our priority.  As part of our continuing mission to provide you with exceptional heart care, we have created designated Provider Care Teams.  These Care Teams include your primary Cardiologist (physician) and Advanced Practice Providers (APPs -  Physician Assistants and Nurse Practitioners) who all work together to provide you with the care you need, when you need it.  We recommend signing up for the patient portal called "MyChart".  Sign up information is provided on this After Visit Summary.  MyChart is used to connect with patients for Virtual Visits (Telemedicine).  Patients are able to view lab/test results, encounter notes, upcoming appointments, etc.  Non-urgent messages can be sent to your provider as well.   To learn more about what you can do with MyChart, go to [email or web address].    Your next appointment:   6 month(s)  The format for your next appointment:   In Person  Provider:   Candee Furbish, MD  Thank you for choosing [facility]!!

## 2020-10-12 NOTE — Progress Notes (Signed)
Cardiology Office Note:    Date:  10/12/2020   ID:  Mark Pruitt, DOB September 11, 1941, MRN [id number]  PCP:  Eulas Post, MD  [facility] Cardiologist:  Candee Furbish, MD  [facility] Electrophysiologist:  None   Referring MD: Eulas Post, MD     History of Present Illness:    Mark Pruitt is a 80 y.o. male here for follow-up of cardiomyopathy, frequent PVCs 8%, no CAD.   NYHA class II.  Had visit with Dr. Lovena Le.  Secondary to PVCs.  Overall has been doing quite well.  No new complaints.  No chest pain fevers chills nausea vomiting syncope bleeding.  Tolerating medications well.  He does have some dizziness at times when he is getting up.  His blood pressure is quite low today 80/40.  We are going to make some dose adjustments to his medications cutting his metoprolol and lisinopril in half.  Past Medical History:  Diagnosis Date  . Cancer [facility])    prostate cancer  . Cataract   . COLONIC POLYPS 07/25/2009  . ELEVATED PROSTATE SPECIFIC ANTIGEN 07/27/2009  . Hearing loss of both ears   . HYPERTROPHY PROSTATE W/UR OBST & OTH LUTS 07/25/2009  . Irregular heart beat   . LICHEN SIMPLEX CHRONICUS 02/03/2009  . Other primary cardiomyopathies 08/08/2010    Past Surgical History:  Procedure Laterality Date  . COLONOSCOPY    . LEFT HEART CATH AND CORONARY ANGIOGRAPHY N/A 01/13/2020   Procedure: LEFT HEART CATH AND CORONARY ANGIOGRAPHY;  Surgeon: Jettie Booze, MD;  Location: [place] [facility];  Service: Cardiovascular;  Laterality: N/A;  . POLYPECTOMY    . PROSTATE SURGERY      Current Medications: Current Meds  Medication Sig  . aspirin EC 81 MG tablet Take 81 mg by mouth daily.  Marland Kitchen Bioflavonoid Products (ESTER-C) TABS Take 1 tablet by mouth daily.  . cholecalciferol (VITAMIN D) 25 MCG (1000 UNIT) tablet Take 1,000 Units by mouth daily.  Marland Kitchen lisinopril (ZESTRIL) 2.5 MG tablet Take 1 tablet (2.5 mg total) by mouth daily.  . metoprolol  succinate (TOPROL-XL) 25 MG 24 hr tablet Take 1 tablet (25 mg total) by mouth daily.  . Multiple Vitamin (MULTIVITAMIN WITH MINERALS) TABS tablet Take 1 tablet by mouth daily.  . nabumetone (RELAFEN) 750 MG tablet Take 1 tablet (750 mg total) by mouth 2 (two) times daily as needed.  . Omega-3 Fatty Acids (FISH OIL) 1000 MG CAPS Take 1,000 mg by mouth daily.   . [DISCONTINUED] lisinopril (ZESTRIL) 5 MG tablet TAKE 1 TABLET BY MOUTH  DAILY  . [DISCONTINUED] metoprolol succinate (TOPROL-XL) 50 MG 24 hr tablet Take 1 tablet (50 mg total) by mouth daily. Take with or immediately following a meal.     Allergies:   Patient has no known allergies.   Social History   Socioeconomic History  . Marital status: Married    Spouse name: Not on file  . Number of children: Not on file  . Years of education: Not on file  . Highest education level: Not on file  Occupational History  . Not on file  Tobacco Use  . Smoking status: Former Smoker    Packs/day: 1.00    Years: 20.00    Pack years: 20.00    Types: Cigarettes    Quit date: 10/22/1983    Years since quitting: 37.0  . Smokeless tobacco: Never Used  Vaping Use  . Vaping Use: Never used  Substance and Sexual Activity  .  Alcohol use: No    Alcohol/week: 0.0 standard drinks  . Drug use: No  . Sexual activity: Not on file  Other Topics Concern  . Not on file  Social History Narrative   From [place] originally    Social Determinants of Health   Financial Resource Strain: Low Risk   . Difficulty of Paying Living Expenses: Not hard at all  Food Insecurity: No Food Insecurity  . Worried About Charity fundraiser in the Last Year: Never true  . Ran Out of Food in the Last Year: Never true  Transportation Needs: No Transportation Needs  . Lack of Transportation (Medical): No  . Lack of Transportation (Non-Medical): No  Physical Activity: Insufficiently Active  . Days of Exercise per Week: 2 days  . Minutes of Exercise per Session: 60  min  Stress: No Stress Concern Present  . Feeling of Stress : Not at all  Social Connections: Moderately Integrated  . Frequency of Communication with Friends and Family: More than three times a week  . Frequency of Social Gatherings with Friends and Family: More than three times a week  . Attends Religious Services: More than 4 times per year  . Active Member of Clubs or Organizations: No  . Attends Archivist Meetings: Never  . Marital Status: Married     Family History: The patient's family history includes Anuerysm (age of onset: 46) in his father; Cancer in his mother. There is no history of Arthritis, Colon cancer, Esophageal cancer, Rectal cancer, or Stomach cancer.  ROS:   Please see the history of present illness.     All other systems reviewed and are negative.  EKGs/Labs/Other Studies Reviewed:    The following studies were reviewed today:    Cath : There is moderate left ventricular systolic dysfunction.  The left ventricular ejection fraction is 25-35% by visual estimate.  LV end diastolic pressure is normal.  There is no aortic valve stenosis.  Ectasia in all three coronary arteries. Paroxysmal SVT noted with a HR to 115 bpm. Patient was asymptomatic.  ECHO 12/29/19   1. Left ventricular ejection fraction, by estimation, is 40 to 45%. The  left ventricle has mildly decreased function. The left ventricle  demonstrates global hypokinesis, most pronounced at base with improvement  in function towards apex. The left  ventricular internal cavity size was mildly dilated. There is mild left  ventricular hypertrophy. Left ventricular diastolic function could not be  evaluated, as tissue Doppler was not done.  2. Right ventricular systolic function is normal. The right ventricular  size is normal. There is normal pulmonary artery systolic pressure. The  estimated right ventricular systolic pressure is [id number] mmHg.  3. The mitral valve is normal in  structure. Mild mitral valve  regurgitation.  4. The aortic valve is tricuspid. Aortic valve regurgitation is trivial.  No aortic stenosis is present.  5. Aortic dilatation noted. There is mild dilatation of the ascending  aorta measuring 37 mm.  6. The inferior vena cava is normal in size with greater than 50%  respiratory variability, suggesting right atrial pressure of 3 mmHg  Montitor:  Sinus rhythm with frequent isolated PVC's (12%)  Rare episodes of slow ventricular tachycardia (6-8 beats with avg of 127 bpm)  Brief paroxysmal atrial tachycardia episodes  No atrial fibrillation, no pauses  ECHO 03/2020:   1. Inferior basal hypokinesis Normal GLS -19.4. Left ventricular ejection  fraction, by estimation, is 55%. The left ventricle has normal function.  The left ventricle demonstrates regional wall motion abnormalities (see  scoring diagram/findings for  description). The left ventricular internal cavity size was mildly  dilated. There is mild left ventricular hypertrophy. Left ventricular  diastolic parameters were normal.  2. Right ventricular systolic function is normal. The right ventricular  size is normal. There is normal pulmonary artery systolic pressure.  3. Left atrial size was mildly dilated.  4. The mitral valve is normal in structure. Mild mitral valve  regurgitation. No evidence of mitral stenosis.  5. The aortic valve is normal in structure. Aortic valve regurgitation is  mild. No aortic stenosis is present.  6. The inferior vena cava is normal in size with greater than 50%  respiratory variability, suggesting right atrial pressure of 3 mmHg.     Recent Labs: 05/20/2020: ALT 11; BUN 18; Creat 0.94; Hemoglobin 15.6; Platelets 169; Potassium 5.3; Sodium 137; TSH 1.76  Recent Lipid Panel    Component Value Date/Time   CHOL 160 05/20/2020 1115   TRIG 79 05/20/2020 1115   HDL 57 05/20/2020 1115   CHOLHDL 2.8 05/20/2020 1115   VLDL 16.4  05/18/2019 1050   LDLCALC 86 05/20/2020 1115     Risk Assessment/Calculations:      Physical Exam:    VS:  BP (!) 80/40 (BP Location: Left Arm, Patient Position: Sitting, Cuff Size: Normal)   Pulse 64   Ht 5\' 11"  (1.803 m)   Wt 191 lb (86.6 kg)   SpO2 96%   BMI 26.64 kg/m     Wt Readings from Last 3 Encounters:  10/12/20 191 lb (86.6 kg)  05/20/20 179 lb 3.2 oz (81.3 kg)  05/20/20 179 lb 3.2 oz (81.3 kg)     GEN:  Well nourished, well developed in no acute distress HEENT: Normal NECK: No JVD; No carotid bruits LYMPHATICS: No lymphadenopathy CARDIAC: RRR, no murmurs, rubs, gallops RESPIRATORY:  Clear to auscultation without rales, wheezing or rhonchi  ABDOMEN: Soft, non-tender, non-distended MUSCULOSKELETAL:  No edema; No deformity  SKIN: Warm and dry NEUROLOGIC:  Alert and oriented x 3 PSYCHIATRIC:  Normal affect   ASSESSMENT:    1. Dilated cardiomyopathy ([place])   2. PVC's (premature ventricular contractions)    PLAN:    In order of problems listed above:   Dilated cardiomyopathy-nonischemic EF 33% on stress test 12/29/2019 Frequent PVCs-8% event monitor, also had frequent PACs. -Appreciate Dr. Tanna Furry consult.  He states that he had 8% PVCs and he was minimally symptomatic.  Does not require ablative therapy.  -At last visit we gently increased Toprol from 25-50.  Blood pressure relatively soft.  I will continue with lisinopril 5 mg once a day.  I do not think he would necessarily be able to tolerate Entresto because of soft blood pressure. -Thankfully, currently NYHA class 2.  Enjoys golf.  No high risk symptoms such as syncope. -Appreciate Dr. Tanna Furry advice.   Cardiac catheterization EF was 25-35, nuclear EF was 33, echo EF was 40-45, some discrepancies noted.   Most recent echocardiogram in July showed restoration of EF 55%   Prior diffuse chest pressure/palpitations -Thankfully, no CAD.  Increasing Toprol at prior visit.  Had generalized ectatic  arteries. -Okay to take aspirin 81 mg every other day.  Hypotension - Blood pressure low today 80/40.  He has noticed that when bending over and getting up he can feel lightheaded at times. - We will go ahead and decrease his Toprol to 25 mg and decrease his lisinopril to 2.5 mg.  He  is requesting 90-day supply to [facility].       Medication Adjustments/Labs and Tests Ordered: Current medicines are reviewed at length with the patient today.  Concerns regarding medicines are outlined above.  No orders of the defined types were placed in this encounter.  Meds ordered this encounter  Medications  . lisinopril (ZESTRIL) 2.5 MG tablet    Sig: Take 1 tablet (2.5 mg total) by mouth daily.    Dispense:  90 tablet    Refill:  3  . metoprolol succinate (TOPROL-XL) 25 MG 24 hr tablet    Sig: Take 1 tablet (25 mg total) by mouth daily.    Dispense:  90 tablet    Refill:  3    Patient Instructions  Medication Instructions:  Please decrease your Metoprolol to 25 mg a day. Decrease your Lisinopril to 2.5 mg a day. Continue all other medications as listed.  *If you need a refill on your cardiac medications before your next appointment, please call your pharmacy*  Follow-Up: At [facility], you and your health needs are our priority.  As part of our continuing mission to provide you with exceptional heart care, we have created designated Provider Care Teams.  These Care Teams include your primary Cardiologist (physician) and Advanced Practice Providers (APPs -  Physician Assistants and Nurse Practitioners) who all work together to provide you with the care you need, when you need it.  We recommend signing up for the patient portal called "MyChart".  Sign up information is provided on this After Visit Summary.  MyChart is used to connect with patients for Virtual Visits (Telemedicine).  Patients are able to view lab/test results, encounter notes, upcoming  appointments, etc.  Non-urgent messages can be sent to your provider as well.   To learn more about what you can do with MyChart, go to [email or web address].    Your next appointment:   6 month(s)  The format for your next appointment:   In Person  Provider:   Candee Furbish, MD  Thank you for choosing [facility]!!         Signed, Candee Furbish, MD  10/12/2020 5:17 PM    [place]

## 2021-02-28 ENCOUNTER — Ambulatory Visit (INDEPENDENT_AMBULATORY_CARE_PROVIDER_SITE_OTHER)
Admission: RE | Admit: 2021-02-28 | Discharge: 2021-02-28 | Disposition: A | Discharge status: Home / Self Care | Payer: Medicare Other | Source: Ambulatory Visit | Attending: Family Medicine | Admitting: Family Medicine

## 2021-02-28 ENCOUNTER — Ambulatory Visit (INDEPENDENT_AMBULATORY_CARE_PROVIDER_SITE_OTHER): Payer: Medicare Other | Admitting: Family Medicine

## 2021-02-28 ENCOUNTER — Other Ambulatory Visit: Payer: Self-pay

## 2021-02-28 ENCOUNTER — Encounter: Payer: Self-pay | Admitting: Family Medicine

## 2021-02-28 VITALS — BP 100/58 | HR 80 | Temp 97.8°F | Wt 188.9 lb

## 2021-02-28 DIAGNOSIS — M25511 Pain in right shoulder: Secondary | ICD-10-CM

## 2021-02-28 NOTE — Patient Instructions (Signed)

## 2021-02-28 NOTE — Progress Notes (Signed)
Established Patient Office Visit  Subjective:  Patient ID: Mark Pruitt, male    DOB: 23-Dec-1940  Age: 80 y.o. MRN: [id number]  CC:  Chief Complaint  Patient presents with  . Shoulder Pain    R shoulder pain, x 6 months, aching pain, getting worse over the past few months, no known injury    HPI Mark Pruitt presents for progressive right shoulder pains for 6 months.  He did move around the time his onset of shoulder pain but does not recall specific injury.  His pain is somewhat poorly localized.  He has some subacromial area but also some around the [facility] joint.  He also has noticed some bony prominence at the sternoclavicular joint on the right side.  Starting to have more night pain.  He states he cannot sleep on his right shoulder secondary to pain.  Using Tylenol arthritis with some relief.  No cervical radiculitis pain.  No definite weakness.  Has some pain with internal rotation and abduction.  He states years ago he had adhesive capsulitis of the left shoulder.  Has not noted any reduced range of motion yet in the right shoulder.  Past Medical History:  Diagnosis Date  . Cancer [facility])    prostate cancer  . Cataract   . COLONIC POLYPS 07/25/2009  . ELEVATED PROSTATE SPECIFIC ANTIGEN 07/27/2009  . Hearing loss of both ears   . HYPERTROPHY PROSTATE W/UR OBST & OTH LUTS 07/25/2009  . Irregular heart beat   . LICHEN SIMPLEX CHRONICUS 02/03/2009  . Other primary cardiomyopathies 08/08/2010    Past Surgical History:  Procedure Laterality Date  . COLONOSCOPY    . LEFT HEART CATH AND CORONARY ANGIOGRAPHY N/A 01/13/2020   Procedure: LEFT HEART CATH AND CORONARY ANGIOGRAPHY;  Surgeon: Jettie Booze, MD;  Location: [place] [facility];  Service: Cardiovascular;  Laterality: N/A;  . POLYPECTOMY    . PROSTATE SURGERY      Family History  Problem Relation Age of Onset  . Cancer Mother        parathyroid adenoma  . Anuerysm Father 61       abdominal  . Arthritis  Neg Hx        family hx  . Colon cancer Neg Hx   . Esophageal cancer Neg Hx   . Rectal cancer Neg Hx   . Stomach cancer Neg Hx     Social History   Socioeconomic History  . Marital status: Married    Spouse name: Not on file  . Number of children: Not on file  . Years of education: Not on file  . Highest education level: Not on file  Occupational History  . Not on file  Tobacco Use  . Smoking status: Former Smoker    Packs/day: 1.00    Years: 20.00    Pack years: 20.00    Types: Cigarettes    Quit date: 10/22/1983    Years since quitting: 37.3  . Smokeless tobacco: Never Used  Vaping Use  . Vaping Use: Never used  Substance and Sexual Activity  . Alcohol use: No    Alcohol/week: 0.0 standard drinks  . Drug use: No  . Sexual activity: Not on file  Other Topics Concern  . Not on file  Social History Narrative   From [place] originally    Social Determinants of Health   Financial Resource Strain: Low Risk   . Difficulty of Paying Living Expenses: Not hard at all  Food Insecurity:  No Food Insecurity  . Worried About Charity fundraiser in the Last Year: Never true  . Ran Out of Food in the Last Year: Never true  Transportation Needs: No Transportation Needs  . Lack of Transportation (Medical): No  . Lack of Transportation (Non-Medical): No  Physical Activity: Insufficiently Active  . Days of Exercise per Week: 2 days  . Minutes of Exercise per Session: 60 min  Stress: No Stress Concern Present  . Feeling of Stress : Not at all  Social Connections: Moderately Integrated  . Frequency of Communication with Friends and Family: More than three times a week  . Frequency of Social Gatherings with Friends and Family: More than three times a week  . Attends Religious Services: More than 4 times per year  . Active Member of Clubs or Organizations: No  . Attends Archivist Meetings: Never  . Marital Status: Married  Human resources officer Violence: Not At Risk  .  Fear of Current or Ex-Partner: No  . Emotionally Abused: No  . Physically Abused: No  . Sexually Abused: No    Outpatient Medications Prior to Visit  Medication Sig Dispense Refill  . aspirin EC 81 MG tablet Take 81 mg by mouth daily.    Marland Kitchen Bioflavonoid Products (ESTER-C) TABS Take 1 tablet by mouth daily.    . cholecalciferol (VITAMIN D) 25 MCG (1000 UNIT) tablet Take 1,000 Units by mouth daily.    Marland Kitchen lisinopril (ZESTRIL) 2.5 MG tablet Take 1 tablet (2.5 mg total) by mouth daily. 90 tablet 3  . metoprolol succinate (TOPROL-XL) 25 MG 24 hr tablet Take 1 tablet (25 mg total) by mouth daily. 90 tablet 3  . Multiple Vitamin (MULTIVITAMIN WITH MINERALS) TABS tablet Take 1 tablet by mouth daily.    . nabumetone (RELAFEN) 750 MG tablet Take 1 tablet (750 mg total) by mouth 2 (two) times daily as needed. 60 tablet 6  . Omega-3 Fatty Acids (FISH OIL) 1000 MG CAPS Take 1,000 mg by mouth daily.      No facility-administered medications prior to visit.    No Known Allergies  ROS Review of Systems  Musculoskeletal: Negative for neck pain.  Neurological: Negative for weakness and numbness.      Objective:    Physical Exam Vitals reviewed.  Constitutional:      Appearance: Normal appearance.  Cardiovascular:     Rate and Rhythm: Normal rate and regular rhythm.  Pulmonary:     Effort: Pulmonary effort is normal.     Breath sounds: Normal breath sounds.  Musculoskeletal:     Comments: He does have slight prominence of the right sternoclavicular joint.  Nontender to palpation.  No acromioclavicular joint tenderness.  Full range of motion with abduction.  Only very limited internal rotation on the right compared to the left.  No biceps tenderness.  Neurological:     Mental Status: He is alert.     Comments: May have very slight weakness rotator cuff testing right versus left     BP (!) 100/58 (BP Location: Left Arm, Patient Position: Sitting, Cuff Size: Normal)   Pulse 80   Temp 97.8 F  (36.6 C) (Oral)   Wt 188 lb 14.4 oz (85.7 kg)   SpO2 94%   BMI 26.35 kg/m  Wt Readings from Last 3 Encounters:  02/28/21 188 lb 14.4 oz (85.7 kg)  10/12/20 191 lb (86.6 kg)  05/20/20 179 lb 3.2 oz (81.3 kg)     Health Maintenance Due  Topic  Date Due  . Zoster Vaccines- Shingrix (1 of 2) Never done  . COVID-19 Vaccine (3 - Pfizer risk 4-dose series) 11/28/2019    There are no preventive care reminders to display for this patient.  Lab Results  Component Value Date   TSH 1.76 05/20/2020   Lab Results  Component Value Date   WBC 6.6 05/20/2020   HGB 15.6 05/20/2020   HCT 46.1 05/20/2020   MCV 90.4 05/20/2020   PLT 169 05/20/2020   Lab Results  Component Value Date   NA 137 05/20/2020   K 5.3 05/20/2020   CO2 27 05/20/2020   GLUCOSE 103 (H) 05/20/2020   BUN 18 05/20/2020   CREATININE 0.94 05/20/2020   BILITOT 1.4 (H) 05/20/2020   ALKPHOS 48 05/18/2019   AST 14 05/20/2020   ALT 11 05/20/2020   PROT 7.2 05/20/2020   ALBUMIN 4.5 05/18/2019   CALCIUM 9.7 05/20/2020   ANIONGAP 5 04/17/2016   GFR 65.83 05/18/2019   Lab Results  Component Value Date   CHOL 160 05/20/2020   Lab Results  Component Value Date   HDL 57 05/20/2020   Lab Results  Component Value Date   LDLCALC 86 05/20/2020   Lab Results  Component Value Date   TRIG 79 05/20/2020   Lab Results  Component Value Date   CHOLHDL 2.8 05/20/2020   No results found for: HGBA1C    Assessment & Plan:   Problem List Items Addressed This Visit   None   Visit Diagnoses    Right shoulder pain, unspecified chronicity    -  Primary   Relevant Orders   DG Shoulder Right    Patient presents with several month history of progressive right shoulder pain.  No specific injury.  He has some mild prominence of the right sternoclavicular joint and suspect this is more degenerative in nature.  Suspect rotator cuff syndrome. -Start with plain x-rays given duration of symptoms -Discussed importance of  maintaining range of motion.  Consider physical therapy after x-rays back. -We also discussed possible referral to sports medicine for further evaluation but will get x-rays first -Avoid regular use of nonsteroidals given his age  No orders of the defined types were placed in this encounter.   Follow-up: No follow-ups on file.    Carolann Littler, MD

## 2021-03-02 ENCOUNTER — Other Ambulatory Visit: Payer: Self-pay

## 2021-03-02 DIAGNOSIS — M25511 Pain in right shoulder: Secondary | ICD-10-CM

## 2021-03-22 DIAGNOSIS — M25511 Pain in right shoulder: Secondary | ICD-10-CM | POA: Diagnosis not present

## 2021-04-13 DIAGNOSIS — M25511 Pain in right shoulder: Secondary | ICD-10-CM | POA: Diagnosis not present

## 2021-04-26 DIAGNOSIS — M75101 Unspecified rotator cuff tear or rupture of right shoulder, not specified as traumatic: Secondary | ICD-10-CM | POA: Diagnosis not present

## 2021-05-17 ENCOUNTER — Ambulatory Visit: Payer: Medicare Other

## 2021-05-24 ENCOUNTER — Ambulatory Visit (INDEPENDENT_AMBULATORY_CARE_PROVIDER_SITE_OTHER): Payer: Medicare Other | Admitting: Family Medicine

## 2021-05-24 ENCOUNTER — Other Ambulatory Visit: Payer: Self-pay

## 2021-05-24 ENCOUNTER — Encounter: Payer: Self-pay | Admitting: Family Medicine

## 2021-05-24 VITALS — BP 100/50 | HR 60 | Temp 98.0°F | Ht 71.0 in | Wt 184.0 lb

## 2021-05-24 DIAGNOSIS — H1045 Other chronic allergic conjunctivitis: Secondary | ICD-10-CM | POA: Diagnosis not present

## 2021-05-24 DIAGNOSIS — Z0000 Encounter for general adult medical examination without abnormal findings: Secondary | ICD-10-CM

## 2021-05-24 DIAGNOSIS — Z961 Presence of intraocular lens: Secondary | ICD-10-CM | POA: Diagnosis not present

## 2021-05-24 DIAGNOSIS — H43813 Vitreous degeneration, bilateral: Secondary | ICD-10-CM | POA: Diagnosis not present

## 2021-05-24 LAB — CBC WITH DIFFERENTIAL/PLATELET
Basophils Absolute: 0 K/uL (ref 0.0–0.1)
Basophils Relative: 0.6 % (ref 0.0–3.0)
Eosinophils Absolute: 0.1 K/uL (ref 0.0–0.7)
Eosinophils Relative: 0.8 % (ref 0.0–5.0)
HCT: 45.4 % (ref 39.0–52.0)
Hemoglobin: 15.1 g/dL (ref 13.0–17.0)
Lymphocytes Relative: 22.9 % (ref 12.0–46.0)
Lymphs Abs: 1.5 K/uL (ref 0.7–4.0)
MCHC: 33.1 g/dL (ref 30.0–36.0)
MCV: 91.5 fl (ref 78.0–100.0)
Monocytes Absolute: 0.6 K/uL (ref 0.1–1.0)
Monocytes Relative: 9.2 % (ref 3.0–12.0)
Neutro Abs: 4.3 K/uL (ref 1.4–7.7)
Neutrophils Relative %: 66.5 % (ref 43.0–77.0)
Platelets: 152 K/uL (ref 150.0–400.0)
RBC: 4.96 Mil/uL (ref 4.22–5.81)
RDW: 14.3 % (ref 11.5–15.5)
WBC: 6.5 K/uL (ref 4.0–10.5)

## 2021-05-24 LAB — TSH: TSH: 1.51 u[IU]/mL (ref 0.35–5.50)

## 2021-05-24 LAB — BASIC METABOLIC PANEL WITH GFR
BUN: 21 mg/dL (ref 6–23)
CO2: 27 meq/L (ref 19–32)
Calcium: 9.9 mg/dL (ref 8.4–10.5)
Chloride: 103 meq/L (ref 96–112)
Creatinine, Ser: 1.13 mg/dL (ref 0.40–1.50)
GFR: 60.85 mL/min (ref >60.00)
Glucose, Bld: 75 mg/dL (ref 70–99)
Potassium: 4.2 meq/L (ref 3.5–5.1)
Sodium: 138 meq/L (ref 135–145)

## 2021-05-24 LAB — HEPATIC FUNCTION PANEL
ALT: 12 U/L (ref 0–53)
AST: 14 U/L (ref 0–37)
Albumin: 4 g/dL (ref 3.5–5.2)
Alkaline Phosphatase: 47 U/L (ref 39–117)
Bilirubin, Direct: 0.2 mg/dL (ref 0.0–0.3)
Total Bilirubin: 1.3 mg/dL — ABNORMAL HIGH (ref 0.2–1.2)
Total Protein: 6.7 g/dL (ref 6.0–8.3)

## 2021-05-24 LAB — LIPID PANEL
Cholesterol: 160 mg/dL (ref 0–200)
HDL: 54.8 mg/dL (ref >39.00)
LDL Cholesterol: 83 mg/dL (ref 0–99)
NonHDL: 105.18
Total CHOL/HDL Ratio: 3
Triglycerides: 111 mg/dL (ref 0.0–149.0)
VLDL: 22.2 mg/dL (ref 0.0–40.0)

## 2021-05-24 NOTE — Progress Notes (Signed)
Established Patient Office Visit  Subjective:  Patient ID: Mark Pruitt, male    DOB: April 22, 1941  Age: 80 y.o. MRN: [id number]  CC:  Chief Complaint  Patient presents with   Annual Exam    No new concerns     HPI Mark Pruitt presents for physical exam.  He has past history of dilated cardiomyopathy but had echo a year ago with ejection fraction 55%.  He has history of PVCs.  He plays golf about once per week.  Also walks his dog several times per day.  No recent dyspnea or chest pains.  Generally feels well.  No recent falls.  Past history of prostate cancer.  Had surgery over 10 years ago for that.  Health maintenance reviewed  -Pneumonia vaccines complete -Tetanus due 2030 -Had previous Zostavax but not Shingrix. -Gets annual flu vaccine. -No indication for further PSA  Social history-he is married and has 2 children.  2 grandchildren.  Quit smoking around 1981.  No alcohol use.  Family history-mother apparently had some sort of parathyroid abnormality but not clear.  She died age 50.  Father died in his 15s of possible abdominal aneurysm.  Family history otherwise unrevealing  Past Medical History:  Diagnosis Date   Cancer [facility])    prostate cancer   Cataract    COLONIC POLYPS 07/25/2009   ELEVATED PROSTATE SPECIFIC ANTIGEN 07/27/2009   Hearing loss of both ears    HYPERTROPHY PROSTATE W/UR OBST & OTH LUTS 07/25/2009   Irregular heart beat    LICHEN SIMPLEX CHRONICUS 02/03/2009   Other primary cardiomyopathies 08/08/2010    Past Surgical History:  Procedure Laterality Date   COLONOSCOPY     LEFT HEART CATH AND CORONARY ANGIOGRAPHY N/A 01/13/2020   Procedure: LEFT HEART CATH AND CORONARY ANGIOGRAPHY;  Surgeon: Jettie Booze, MD;  Location: [place] [facility];  Service: Cardiovascular;  Laterality: N/A;   POLYPECTOMY     PROSTATE SURGERY      Family History  Problem Relation Age of Onset   Cancer Mother        parathyroid adenoma   Anuerysm  Father 44       abdominal   Arthritis Neg Hx        family hx   Colon cancer Neg Hx    Esophageal cancer Neg Hx    Rectal cancer Neg Hx    Stomach cancer Neg Hx     Social History   Socioeconomic History   Marital status: Married    Spouse name: Not on file   Number of children: Not on file   Years of education: Not on file   Highest education level: Not on file  Occupational History   Not on file  Tobacco Use   Smoking status: Former    Packs/day: 1.00    Years: 20.00    Pack years: 20.00    Types: Cigarettes    Quit date: 10/22/1983    Years since quitting: 37.6   Smokeless tobacco: Never  Vaping Use   Vaping Use: Never used  Substance and Sexual Activity   Alcohol use: No    Alcohol/week: 0.0 standard drinks   Drug use: No   Sexual activity: Not on file  Other Topics Concern   Not on file  Social History Narrative   From [place] originally    Social Determinants of Health   Financial Resource Strain: Not on file  Food Insecurity: Not on file  Transportation Needs: Not  on file  Physical Activity: Not on file  Stress: Not on file  Social Connections: Not on file  Intimate Partner Violence: Not on file    Outpatient Medications Prior to Visit  Medication Sig Dispense Refill   aspirin EC 81 MG tablet Take 81 mg by mouth daily.     Bioflavonoid Products (ESTER-C) TABS Take 1 tablet by mouth daily.     cholecalciferol (VITAMIN D) 25 MCG (1000 UNIT) tablet Take 1,000 Units by mouth daily.     lisinopril (ZESTRIL) 2.5 MG tablet Take 1 tablet (2.5 mg total) by mouth daily. 90 tablet 3   metoprolol succinate (TOPROL-XL) 25 MG 24 hr tablet Take 1 tablet (25 mg total) by mouth daily. 90 tablet 3   Multiple Vitamin (MULTIVITAMIN WITH MINERALS) TABS tablet Take 1 tablet by mouth daily.     nabumetone (RELAFEN) 750 MG tablet Take 1 tablet (750 mg total) by mouth 2 (two) times daily as needed. 60 tablet 6   Omega-3 Fatty Acids (FISH OIL) 1000 MG CAPS Take 1,000 mg by  mouth daily.      No facility-administered medications prior to visit.    No Known Allergies  ROS Review of Systems  Constitutional:  Negative for activity change, appetite change, fatigue and fever.  HENT:  Negative for congestion, ear pain and trouble swallowing.   Eyes:  Negative for pain and visual disturbance.  Respiratory:  Negative for cough, shortness of breath and wheezing.   Cardiovascular:  Negative for chest pain and palpitations.  Gastrointestinal:  Negative for abdominal distention, abdominal pain, blood in stool, constipation, diarrhea, nausea, rectal pain and vomiting.  Genitourinary:  Negative for dysuria, hematuria and testicular pain.  Musculoskeletal:  Negative for arthralgias and joint swelling.  Skin:  Negative for rash.  Neurological:  Negative for dizziness, syncope and headaches.  Hematological:  Negative for adenopathy.  Psychiatric/Behavioral:  Negative for confusion and dysphoric mood.      Objective:    Physical Exam Constitutional:      General: He is not in acute distress.    Appearance: He is well-developed.  HENT:     Head: Normocephalic and atraumatic.     Right Ear: External ear normal.     Left Ear: External ear normal.  Eyes:     Conjunctiva/sclera: Conjunctivae normal.     Pupils: Pupils are equal, round, and reactive to light.  Neck:     Thyroid: No thyromegaly.  Cardiovascular:     Rate and Rhythm: Normal rate and regular rhythm.     Heart sounds: Normal heart sounds. No murmur heard. Pulmonary:     Effort: No respiratory distress.     Breath sounds: No wheezing or rales.  Abdominal:     General: Bowel sounds are normal. There is no distension.     Palpations: Abdomen is soft. There is no mass.     Tenderness: There is no abdominal tenderness. There is no guarding or rebound.  Musculoskeletal:     Cervical back: Normal range of motion and neck supple.     Right lower leg: No edema.     Left lower leg: No edema.   Lymphadenopathy:     Cervical: No cervical adenopathy.  Skin:    Findings: No rash.  Neurological:     Mental Status: He is alert and oriented to person, place, and time.     Cranial Nerves: No cranial nerve deficit.     Deep Tendon Reflexes: Reflexes normal.    BP Marland Kitchen)  100/50 (BP Location: Left Arm, Patient Position: Sitting, Cuff Size: Normal)   Pulse 60   Temp 98 F (36.7 C) (Oral)   Ht '5\' 11"'$  (1.803 m)   Wt 184 lb (83.5 kg)   SpO2 97%   BMI 25.66 kg/m  Wt Readings from Last 3 Encounters:  05/24/21 184 lb (83.5 kg)  02/28/21 188 lb 14.4 oz (85.7 kg)  10/12/20 191 lb (86.6 kg)     Health Maintenance Due  Topic Date Due   Zoster Vaccines- Shingrix (1 of 2) Never done   COVID-19 Vaccine (3 - Pfizer risk series) 11/28/2019   INFLUENZA VACCINE  05/01/2021    There are no preventive care reminders to display for this patient.  Lab Results  Component Value Date   TSH 1.76 05/20/2020   Lab Results  Component Value Date   WBC 6.6 05/20/2020   HGB 15.6 05/20/2020   HCT 46.1 05/20/2020   MCV 90.4 05/20/2020   PLT 169 05/20/2020   Lab Results  Component Value Date   NA 137 05/20/2020   K 5.3 05/20/2020   CO2 27 05/20/2020   GLUCOSE 103 (H) 05/20/2020   BUN 18 05/20/2020   CREATININE 0.94 05/20/2020   BILITOT 1.4 (H) 05/20/2020   ALKPHOS 48 05/18/2019   AST 14 05/20/2020   ALT 11 05/20/2020   PROT 7.2 05/20/2020   ALBUMIN 4.5 05/18/2019   CALCIUM 9.7 05/20/2020   ANIONGAP 5 04/17/2016   GFR 65.83 05/18/2019   Lab Results  Component Value Date   CHOL 160 05/20/2020   Lab Results  Component Value Date   HDL 57 05/20/2020   Lab Results  Component Value Date   LDLCALC 86 05/20/2020   Lab Results  Component Value Date   TRIG 79 05/20/2020   Lab Results  Component Value Date   CHOLHDL 2.8 05/20/2020   No results found for: HGBA1C    Assessment & Plan:   Problem List Items Addressed This Visit   None Visit Diagnoses     Physical exam    -   Primary   Relevant Orders   Basic metabolic panel   Lipid panel   CBC with Differential/Platelet   TSH   Hepatic function panel     -Reminder for annual flu vaccine -Obtain screening labs as above -Discussed Shingrix vaccine and they will consider checking regarding insurance coverage. -Continue regular exercise.  Recommend minimum 150 minutes of moderate intensity exercise per week.  No orders of the defined types were placed in this encounter.   Follow-up: No follow-ups on file.    Carolann Littler, MD

## 2021-09-07 ENCOUNTER — Telehealth: Payer: Self-pay | Admitting: Family Medicine

## 2021-09-07 ENCOUNTER — Telehealth: Payer: Medicare Other | Admitting: Family Medicine

## 2021-09-07 DIAGNOSIS — B342 Coronavirus infection, unspecified: Secondary | ICD-10-CM | POA: Diagnosis not present

## 2021-09-07 NOTE — Telephone Encounter (Signed)
Patient calling in with respiratory symptoms: Shortness of breath, chest pain, palpitations or other red words send to Triage  Does the patient have a fever over 100, cough, congestion, sore throat, runny nose, lost of taste/smell (please list symptoms that patient has)? Fever, fatigue, "not feeling good"  What date did symptoms start? (If over 5 days ago, pt may be scheduled for in person visit)  Have you tested for Covid in the last 5 days? Yes   If yes, was it positive [x]  OR negative [] ? If positive in the last 5 days, please schedule virtual visit now.   Patient says that he tested for COVID last night (positive result) and again this morning (negative result).

## 2021-09-08 ENCOUNTER — Encounter: Payer: Self-pay | Admitting: Family Medicine

## 2021-09-08 ENCOUNTER — Telehealth (INDEPENDENT_AMBULATORY_CARE_PROVIDER_SITE_OTHER): Payer: Medicare Other | Admitting: Family Medicine

## 2021-09-08 DIAGNOSIS — U071 COVID-19: Secondary | ICD-10-CM | POA: Diagnosis not present

## 2021-09-08 NOTE — Progress Notes (Signed)
Virtual Visit via Telephone Note  I connected with BURRELL HODAPP on 09/08/21 at  3:30 PM EST by telephone and verified that I am speaking with the correct person using two identifiers.   I discussed the limitations, risks, security and privacy concerns of performing an evaluation and management service by telephone and the availability of in person appointments. I also discussed with the patient that there may be a patient responsible charge related to this service. The patient expressed understanding and agreed to proceed.  Location patient: home Location provider: work or home office Participants present for the call: patient, provider Patient did not have a visit in the prior 7 days to address this/these issue(s). Chief Complaint  Patient presents with   Covid Positive    Tested positive on Wednesday and Thursday, did have a fever and just generally states did not feel good, and was having trouble sleeping. States now he is feeling better, has been taking Tylenol.      History of Present Illness: Pt is an 80 yo male with pmh sig for dilated cardiomyopathy started feeling bad on Monday with scratchy throat.  Temperature 100.5F on Tuesday.  Had a negative COVID test Tuesday.  Wed symptoms increased and at home COVID test was positive.  Tested positive again at a clinic on Thursday.  Pt started taking Tylenol for Tmax 102 F.  Pt afebrile today and feeling a lot better today.  Slept well last night.  Had malaise, congestion, fatigue, insomnia, decreased appetite, HA, sore throat, intermittent cough during the week.  Denies ear pain/pressure, facial pressure.    Sick contacts include friend at church on Sunday 09/03/21.  Patient's wife has remained COVID negative and asymptomatic.  Patient had 2 COVID 19 vaccines.   Observations/Objective: Patient sounds cheerful and well on the phone. I do not appreciate any SOB. Speech and thought processing are grossly intact. Patient reported  vitals: pO2 96-97%, temp 97.9 F  Assessment and Plan: COVID-19 virus infection -improving -symptoms starting 09/03/21.  Positive at home COVID test 09/06/21 -Discussed  r/b/a a of antiviral medications.  Given patient reported mild symptoms that are improving, antiviral medication not indicated. --Continue supportive care including rest, hydration, Tylenol, etc. -Given strict precautions for worsening symptoms  Follow Up Instructions: F/u prn   [id number] [id number] [id number] [id number] [id number] 21-30 I did not refer this patient for an OV in the next 24 hours for this/these issue(s).  I discussed the assessment and treatment plan with the patient. The patient was provided an opportunity to ask questions and all were answered. The patient agreed with the plan and demonstrated an understanding of the instructions.   The patient was advised to call back or seek an in-person evaluation if the symptoms worsen or if the condition fails to improve as anticipated.  I provided 12 minutes of non-face-to-face time during this encounter.   Billie Ruddy, MD

## 2021-10-31 ENCOUNTER — Other Ambulatory Visit: Payer: Self-pay | Admitting: Cardiology

## 2021-11-13 ENCOUNTER — Ambulatory Visit (INDEPENDENT_AMBULATORY_CARE_PROVIDER_SITE_OTHER): Payer: Medicare Other

## 2021-11-13 VITALS — Ht 71.0 in | Wt 184.0 lb

## 2021-11-13 DIAGNOSIS — Z0000 Encounter for general adult medical examination without abnormal findings: Secondary | ICD-10-CM

## 2021-11-13 NOTE — Patient Instructions (Addendum)
Mark Pruitt , Thank you for taking time to come for your Medicare Wellness Visit. I appreciate your ongoing commitment to your health goals. Please review the following plan we discussed and let me know if I can assist you in the future.   These are the goals we discussed:  Goals      Patient Stated     I will continue to play golf.        This is a list of the screening recommended for you and due dates:  Health Maintenance  Topic Date Due   Flu Shot  05/01/2021   COVID-19 Vaccine (3 - Pfizer risk series) 11/29/2021*   Zoster (Shingles) Vaccine (1 of 2) 02/10/2022*   Tetanus Vaccine  05/17/2029   Pneumonia Vaccine  Completed   HPV Vaccine  Aged Out  *Topic was postponed. The date shown is not the original due date.    Advanced directives: Yes Copy on file  Conditions/risks identified: None  Next appointment: Follow up in one year for your annual wellness visit.   Preventive Care 65 Years and Older, Male Preventive care refers to lifestyle choices and visits with your health care provider that can promote health and wellness. What does preventive care include? A yearly physical exam. This is also called an annual well check. Dental exams once or twice a year. Routine eye exams. Ask your health care provider how often you should have your eyes checked. Personal lifestyle choices, including: Daily care of your teeth and gums. Regular physical activity. Eating a healthy diet. Avoiding tobacco and drug use. Limiting alcohol use. Practicing safe sex. Taking low doses of aspirin every day. Taking vitamin and mineral supplements as recommended by your health care provider. What happens during an annual well check? The services and screenings done by your health care provider during your annual well check will depend on your age, overall health, lifestyle risk factors, and family history of disease. Counseling  Your health care provider may ask you questions about  your: Alcohol use. Tobacco use. Drug use. Emotional well-being. Home and relationship well-being. Sexual activity. Eating habits. History of falls. Memory and ability to understand (cognition). Work and work Statistician. Screening  You may have the following tests or measurements: Height, weight, and BMI. Blood pressure. Lipid and cholesterol levels. These may be checked every 5 years, or more frequently if you are over 75 years old. Skin check. Lung cancer screening. You may have this screening every year starting at age 82 if you have a 30-pack-year history of smoking and currently smoke or have quit within the past 15 years. Fecal occult blood test (FOBT) of the stool. You may have this test every year starting at age 45. Flexible sigmoidoscopy or colonoscopy. You may have a sigmoidoscopy every 5 years or a colonoscopy every 10 years starting at age 77. Prostate cancer screening. Recommendations will vary depending on your family history and other risks. Hepatitis C blood test. Hepatitis B blood test. Sexually transmitted disease (STD) testing. Diabetes screening. This is done by checking your blood sugar (glucose) after you have not eaten for a while (fasting). You may have this done every 1-3 years. Abdominal aortic aneurysm (AAA) screening. You may need this if you are a current or former smoker. Osteoporosis. You may be screened starting at age 53 if you are at high risk. Talk with your health care provider about your test results, treatment options, and if necessary, the need for more tests. Vaccines  Your health care  provider may recommend certain vaccines, such as: Influenza vaccine. This is recommended every year. Tetanus, diphtheria, and acellular pertussis (Tdap, Td) vaccine. You may need a Td booster every 10 years. Zoster vaccine. You may need this after age 42. Pneumococcal 13-valent conjugate (PCV13) vaccine. One dose is recommended after age 11. Pneumococcal  polysaccharide (PPSV23) vaccine. One dose is recommended after age 74. Talk to your health care provider about which screenings and vaccines you need and how often you need them. This information is not intended to replace advice given to you by your health care provider. Make sure you discuss any questions you have with your health care provider. Document Released: 10/14/2015 Document Revised: 06/06/2016 Document Reviewed: 07/19/2015 Elsevier Interactive Patient Education  2017 [place] Prevention in the Home Falls can cause injuries. They can happen to people of all ages. There are many things you can do to make your home safe and to help prevent falls. What can I do on the outside of my home? Regularly fix the edges of walkways and driveways and fix any cracks. Remove anything that might make you trip as you walk through a door, such as a raised step or threshold. Trim any bushes or trees on the path to your home. Use bright outdoor lighting. Clear any walking paths of anything that might make someone trip, such as rocks or tools. Regularly check to see if handrails are loose or broken. Make sure that both sides of any steps have handrails. Any raised decks and porches should have guardrails on the edges. Have any leaves, snow, or ice cleared regularly. Use sand or salt on walking paths during winter. Clean up any spills in your garage right away. This includes oil or grease spills. What can I do in the bathroom? Use night lights. Install grab bars by the toilet and in the tub and shower. Do not use towel bars as grab bars. Use non-skid mats or decals in the tub or shower. If you need to sit down in the shower, use a plastic, non-slip stool. Keep the floor dry. Clean up any water that spills on the floor as soon as it happens. Remove soap buildup in the tub or shower regularly. Attach bath mats securely with double-sided non-slip rug tape. Do not have throw rugs and other  things on the floor that can make you trip. What can I do in the bedroom? Use night lights. Make sure that you have a light by your bed that is easy to reach. Do not use any sheets or blankets that are too big for your bed. They should not hang down onto the floor. Have a firm chair that has side arms. You can use this for support while you get dressed. Do not have throw rugs and other things on the floor that can make you trip. What can I do in the kitchen? Clean up any spills right away. Avoid walking on wet floors. Keep items that you use a lot in easy-to-reach places. If you need to reach something above you, use a strong step stool that has a grab bar. Keep electrical cords out of the way. Do not use floor polish or wax that makes floors slippery. If you must use wax, use non-skid floor wax. Do not have throw rugs and other things on the floor that can make you trip. What can I do with my stairs? Do not leave any items on the stairs. Make sure that there are handrails on both  sides of the stairs and use them. Fix handrails that are broken or loose. Make sure that handrails are as long as the stairways. Check any carpeting to make sure that it is firmly attached to the stairs. Fix any carpet that is loose or worn. Avoid having throw rugs at the top or bottom of the stairs. If you do have throw rugs, attach them to the floor with carpet tape. Make sure that you have a light switch at the top of the stairs and the bottom of the stairs. If you do not have them, ask someone to add them for you. What else can I do to help prevent falls? Wear shoes that: Do not have high heels. Have rubber bottoms. Are comfortable and fit you well. Are closed at the toe. Do not wear sandals. If you use a stepladder: Make sure that it is fully opened. Do not climb a closed stepladder. Make sure that both sides of the stepladder are locked into place. Ask someone to hold it for you, if possible. Clearly  mark and make sure that you can see: Any grab bars or handrails. First and last steps. Where the edge of each step is. Use tools that help you move around (mobility aids) if they are needed. These include: Canes. Walkers. Scooters. Crutches. Turn on the lights when you go into a dark area. Replace any light bulbs as soon as they burn out. Set up your furniture so you have a clear path. Avoid moving your furniture around. If any of your floors are uneven, fix them. If there are any pets around you, be aware of where they are. Review your medicines with your doctor. Some medicines can make you feel dizzy. This can increase your chance of falling. Ask your doctor what other things that you can do to help prevent falls. This information is not intended to replace advice given to you by your health care provider. Make sure you discuss any questions you have with your health care provider. Document Released: 07/14/2009 Document Revised: 02/23/2016 Document Reviewed: 10/22/2014 Elsevier Interactive Patient Education  2017 [employer].

## 2021-11-13 NOTE — Progress Notes (Signed)
Subjective:   Mark Pruitt is a 81 y.o. male who presents for Medicare Annual/Subsequent preventive examination.  Review of Systems    Virtual Visit via Telephone Note  I connected with  [name] on 11/13/21 at  1:00 PM EST by telephone and verified that I am speaking with the correct person using two identifiers.  Location: Patient: Home Provider: Office Persons participating in the virtual visit: patient/Nurse Health Advisor   I discussed the limitations, risks, security and privacy concerns of performing an evaluation and management service by telephone and the availability of in person appointments. The patient expressed understanding and agreed to proceed.  Interactive audio and video telecommunications were attempted between this nurse and patient, however failed, due to patient having technical difficulties OR patient did not have access to video capability.  We continued and completed visit with audio only.  Some vital signs may be absent or patient reported.   Criselda Peaches, LPN  Cardiac Risk Factors include: advanced age (>53men, >54 women);male gender     Objective:    Today's Vitals   11/13/21 1253  Weight: 184 lb (83.5 kg)  Height: 5\' 11"  (1.803 m)   Body mass index is 25.66 kg/m.  Advanced Directives 11/13/2021 05/20/2020 05/20/2020 01/13/2020 04/15/2016 09/06/2014  Does Patient Have a Medical Advance Directive? Yes Yes Yes Yes No Yes  Type of Paramedic of [place];Living will [place];Living will [place];Living will [place];Living will - [place]  Does patient want to make changes to medical advance directive? No - Patient declined No - Patient declined No - Patient declined No - Patient declined - -  Copy of [place] in Chart? Yes - validated most recent copy scanned in chart (See row information) Yes - validated most  recent copy scanned in chart (See row information) Yes - validated most recent copy scanned in chart (See row information) No - copy requested - -  Would patient like information on creating a medical advance directive? - - - - No - patient declined information -    Current Medications (verified) Outpatient Encounter Medications as of 11/13/2021  Medication Sig   Bioflavonoid Products (ESTER-C) TABS Take 1 tablet by mouth daily.   cholecalciferol (VITAMIN D) 25 MCG (1000 UNIT) tablet Take 1,000 Units by mouth daily.   lisinopril (ZESTRIL) 2.5 MG tablet Take 1 tablet (2.5 mg total) by mouth daily. Please schedule appt for futurte refills. 1st attempt   metoprolol succinate (TOPROL-XL) 25 MG 24 hr tablet Take 1 tablet (25 mg total) by mouth daily. Please schedule appt for future refills. 1st attempt   Multiple Vitamin (MULTIVITAMIN WITH MINERALS) TABS tablet Take 1 tablet by mouth daily.   Omega-3 Fatty Acids (FISH OIL) 1000 MG CAPS Take 1,000 mg by mouth daily.    aspirin EC 81 MG tablet Take 81 mg by mouth daily. (Patient not taking: Reported on 11/13/2021)   nabumetone (RELAFEN) 750 MG tablet Take 1 tablet (750 mg total) by mouth 2 (two) times daily as needed.   No facility-administered encounter medications on file as of 11/13/2021.    Allergies (verified) Patient has no known allergies.   History: Past Medical History:  Diagnosis Date   Cancer [facility])    prostate cancer   Cataract    COLONIC POLYPS 07/25/2009   ELEVATED PROSTATE SPECIFIC ANTIGEN 07/27/2009   Hearing loss of both ears    HYPERTROPHY PROSTATE W/UR  OBST & OTH LUTS 07/25/2009   Irregular heart beat    LICHEN SIMPLEX CHRONICUS 02/03/2009   Other primary cardiomyopathies 08/08/2010   Past Surgical History:  Procedure Laterality Date   COLONOSCOPY     LEFT HEART CATH AND CORONARY ANGIOGRAPHY N/A 01/13/2020   Procedure: LEFT HEART CATH AND CORONARY ANGIOGRAPHY;  Surgeon: Jettie Booze, MD;  Location: [facility];  Service: Cardiovascular;  Laterality: N/A;   POLYPECTOMY     PROSTATE SURGERY     Family History  Problem Relation Age of Onset   Cancer Mother        parathyroid adenoma   Anuerysm Father 73       abdominal   Arthritis Neg Hx        family hx   Colon cancer Neg Hx    Esophageal cancer Neg Hx    Rectal cancer Neg Hx    Stomach cancer Neg Hx    Social History   Socioeconomic History   Marital status: Married    Spouse name: Not on file   Number of children: Not on file   Years of education: Not on file   Highest education level: Not on file  Occupational History   Not on file  Tobacco Use   Smoking status: Former    Packs/day: 1.00    Years: 20.00    Pack years: 20.00    Types: Cigarettes    Quit date: 10/22/1983    Years since quitting: 38.0   Smokeless tobacco: Never  Vaping Use   Vaping Use: Never used  Substance and Sexual Activity   Alcohol use: No    Alcohol/week: 0.0 standard drinks   Drug use: No   Sexual activity: Not on file  Other Topics Concern   Not on file  Social History Narrative   From [place] originally    Social Determinants of Health   Financial Resource Strain: Low Risk    Difficulty of Paying Living Expenses: Not hard at all  Food Insecurity: No Food Insecurity   Worried About Charity fundraiser in the Last Year: Never true   Arboriculturist in the Last Year: Never true  Transportation Needs: No Transportation Needs   Lack of Transportation (Medical): No   Lack of Transportation (Non-Medical): No  Physical Activity: Insufficiently Active   Days of Exercise per Week: 2 days   Minutes of Exercise per Session: 30 min  Stress: No Stress Concern Present   Feeling of Stress : Not at all  Social Connections: Socially Integrated   Frequency of Communication with Friends and Family: More than three times a week   Frequency of Social Gatherings with Friends and Family: Twice a week   Attends Religious Services: More than 4 times  per year   Active Member of [employer] or Organizations: Yes   Attends Archivist Meetings: More than 4 times per year   Marital Status: Married      Clinical Intake:   How often do you need to have someone help you when you read instructions, pamphlets, or other written materials from your doctor or pharmacy?: 1 - Never  Diabetic? No  Interpreter Needed?: NoActivities of Daily Living In your present state of health, do you have any difficulty performing the following activities: 11/13/2021 11/07/2021  Hearing? N Y  Vision? N N  Difficulty concentrating or making decisions? N N  Walking or climbing stairs? N N  Dressing or bathing? N N  Doing errands, shopping? N N  Preparing Food and eating ? N N  Using the Toilet? N N  In the past six months, have you accidently leaked urine? N Y  Do you have problems with loss of bowel control? N N  Managing your Medications? N N  Managing your Finances? N N  Housekeeping or managing your Housekeeping? N N  Some recent data might be hidden    Patient Care Team: Eulas Post, MD as PCP - General Jerline Pain, MD as PCP - Cardiology (Cardiology)  Indicate any recent Medical Services you may have received from other than [facility] providers in the past year (date may be approximate).     Assessment:   This is a routine wellness examination for Shantanu.  Hearing/Vision screen Hearing Screening - Comments:: Wears hearing aids Vision Screening - Comments:: Wears glasses. Followed by Dr Bing Plume  Dietary issues and exercise activities discussed: Current Exercise Habits: Home exercise routine, Type of exercise: walking, Time (Minutes): 30, Frequency (Times/Week): 2, Weekly Exercise (Minutes/Week): 60, Intensity: Moderate, Exercise limited by: None identified   Goals Addressed             This Visit's Progress    Patient Stated       I will continue to play golf.       Depression Screen PHQ 2/9 Scores 11/13/2021 05/20/2020  05/20/2020 05/18/2019 03/31/2018 03/27/2017 01/03/2016  PHQ - 2 Score 0 0 0 0 0 0 0  PHQ- 9 Score - 0 - - - - -    Fall Risk Fall Risk  11/13/2021 11/07/2021 05/20/2020 05/20/2020 05/18/2019  Falls in the past year? 0 1 0 0 0  Number falls in past yr: 0 0 0 0 0  Injury with Fall? 0 0 0 0 0  Comment Fall no injury or medical attention needed - - - -  Risk for fall due to : Impaired balance/gait - Medication side effect - -  Follow up Education provided - Falls evaluation completed;Falls prevention discussed Falls evaluation completed Falls evaluation completed    FALL RISK PREVENTION PERTAINING TO THE HOME:  Any stairs in or around the home? Yes  If so, are there any without handrails? No  Home free of loose throw rugs in walkways, pet beds, electrical cords, etc? Yes  Adequate lighting in your home to reduce risk of falls? No   ASSISTIVE DEVICES UTILIZED TO PREVENT FALLS:  Life alert? No  Use of a cane, walker or w/c? No  Grab bars in the bathroom? Yes  Shower chair or bench in shower? Yes  Elevated toilet seat or a handicapped toilet? Yes   TIMED UP AND GO:  Was the test performed? No . Audio Visit  Cognitive Function:     6CIT Screen 11/13/2021 05/20/2020  What Year? 0 points 0 points  What month? 0 points 0 points  What time? 0 points 0 points  Count back from 20 0 points 0 points  Months in reverse 0 points 0 points  Repeat phrase 0 points 0 points  Total Score 0 0    Immunizations Immunization History  Administered Date(s) Administered   Fluad Quad(high Dose 65+) 06/10/2019   Hepatitis A 07/25/2009   Influenza Split 09/05/2011, 08/04/2012   Influenza Whole 07/25/2009, 07/27/2010   Influenza, High Dose Seasonal PF 07/06/2015, 07/30/2017, 07/15/2018   Influenza,inj,Quad PF,6+ Mos 06/15/2013, 05/19/2014   Influenza-Unspecified 06/27/2016   PFIZER(Purple Top)SARS-COV-2 Vaccination 10/13/2019, 10/31/2019   Pneumococcal Conjugate-13 07/06/2015   Pneumococcal  Polysaccharide-23 10/02/2007   Td 10/02/2007   Tdap 05/18/2019, 05/18/2019   Zoster, Live 05/01/2008    TDAP status: Up to date  Flu Vaccine status: Up to date  Pneumococcal vaccine status: Up to date  Covid-19 vaccine status: Information provided on how to obtain vaccines.   Qualifies for Shingles Vaccine? Yes   Zostavax completed No   Shingrix Completed?: No.    Education has been provided regarding the importance of this vaccine. Patient has been advised to call insurance company to determine out of pocket expense if they have not yet received this vaccine. Advised may also receive vaccine at local pharmacy or Health Dept. Verbalized acceptance and understanding.  Screening Tests Health Maintenance  Topic Date Due   INFLUENZA VACCINE  05/01/2021   COVID-19 Vaccine (3 - Pfizer risk series) 11/29/2021 (Originally 11/28/2019)   Zoster Vaccines- Shingrix (1 of 2) 02/10/2022 (Originally 08/16/1960)   TETANUS/TDAP  05/17/2029   Pneumonia Vaccine 58+ Years old  Completed   HPV VACCINES  Aged Out    Health Maintenance  Health Maintenance Due  Topic Date Due   INFLUENZA VACCINE  05/01/2021    Colorectal cancer screening: No longer required.   Lung Cancer Screening: (Low Dose CT Chest recommended if Age 12-80 years, 30 pack-year currently smoking OR have quit w/in 15years.) does not qualify.   Additional Screening:  Hepatitis C Screening: does not qualify; Completed   Vision Screening: Recommended annual ophthalmology exams for early detection of glaucoma and other disorders of the eye. Is the patient up to date with their annual eye exam?  Yes  Who is the provider or what is the name of the office in which the patient attends annual eye exams? Dr Bing Plume If pt is not established with a provider, would they like to be referred to a provider to establish care? No .   Dental Screening: Recommended annual dental exams for proper oral hygiene  Community Resource Referral /  Chronic Care Management:  CRR required this visit?  No   CCM required this visit?  No      Plan:     I have personally reviewed and noted the following in the patients chart:   Medical and social history Use of alcohol, tobacco or illicit drugs  Current medications and supplements including opioid prescriptions. Patient is not currently taking opioid prescriptions. Functional ability and status Nutritional status Physical activity Advanced directives List of other physicians Hospitalizations, surgeries, and ER visits in previous 12 months Vitals Screenings to include cognitive, depression, and falls Referrals and appointments  In addition, I have reviewed and discussed with patient certain preventive protocols, quality metrics, and best practice recommendations. A written personalized care plan for preventive services as well as general preventive health recommendations were provided to patient.     Criselda Peaches, LPN   [id number]   Nurse Notes: None

## 2022-01-31 ENCOUNTER — Other Ambulatory Visit: Payer: Self-pay | Admitting: Cardiology

## 2022-02-07 ENCOUNTER — Other Ambulatory Visit: Payer: Self-pay | Admitting: Cardiology

## 2022-03-06 ENCOUNTER — Other Ambulatory Visit: Payer: Self-pay | Admitting: Cardiology

## 2022-03-06 ENCOUNTER — Other Ambulatory Visit: Payer: Self-pay | Admitting: Family Medicine

## 2022-03-09 ENCOUNTER — Telehealth: Payer: Self-pay | Admitting: Family Medicine

## 2022-03-09 NOTE — Telephone Encounter (Signed)
Called and spoke with patient.   Dr. Marlou Porch has been prescribing the medication for the patient.    Message in chart is for patient to contact Dr. Marlou Porch office for refill.   Patient requested if Dr. Elease Hashimoto could take over refilling these prescription, also if need to come in for OV patient is willing.     Pharmacy updated.     Please advise.

## 2022-03-09 NOTE — Telephone Encounter (Signed)
Pt call and stated he need a refill on metoprolol succinate (TOPROL-XL) 25 MG 24 hr tablet 90 day  supply with refills and lisinopril (ZESTRIL) 2.5 MG tablet 90 day supply with refills.

## 2022-03-12 ENCOUNTER — Other Ambulatory Visit: Payer: Self-pay

## 2022-03-12 DIAGNOSIS — Z8679 Personal history of other diseases of the circulatory system: Secondary | ICD-10-CM

## 2022-03-12 MED ORDER — METOPROLOL SUCCINATE ER 25 MG PO TB24: 25.0000 mg | ORAL_TABLET | Freq: Every day | ORAL | 3 refills | Status: DC

## 2022-03-12 MED ORDER — LISINOPRIL 2.5 MG PO TABS: 2.5000 mg | ORAL_TABLET | Freq: Every day | ORAL | 3 refills | Status: DC

## 2022-03-12 NOTE — Telephone Encounter (Signed)
One year supply for Lisinopril and Metoprolol has been sent to [facility].

## 2022-05-28 DIAGNOSIS — H1045 Other chronic allergic conjunctivitis: Secondary | ICD-10-CM | POA: Diagnosis not present

## 2022-05-28 DIAGNOSIS — Z961 Presence of intraocular lens: Secondary | ICD-10-CM | POA: Diagnosis not present

## 2022-05-28 DIAGNOSIS — H11153 Pinguecula, bilateral: Secondary | ICD-10-CM | POA: Diagnosis not present

## 2022-05-28 DIAGNOSIS — H43813 Vitreous degeneration, bilateral: Secondary | ICD-10-CM | POA: Diagnosis not present

## 2022-07-17 ENCOUNTER — Telehealth (INDEPENDENT_AMBULATORY_CARE_PROVIDER_SITE_OTHER): Payer: Medicare Other | Admitting: Family Medicine

## 2022-07-17 ENCOUNTER — Encounter: Payer: Self-pay | Admitting: Family Medicine

## 2022-07-17 VITALS — HR 100 | Ht 71.0 in | Wt 184.0 lb

## 2022-07-17 DIAGNOSIS — U071 COVID-19: Secondary | ICD-10-CM

## 2022-07-17 MED ORDER — NIRMATRELVIR/RITONAVIR (PAXLOVID)TABLET: 3.0000 | ORAL_TABLET | Freq: Two times a day (BID) | ORAL | 0 refills | Status: AC

## 2022-07-17 NOTE — Progress Notes (Signed)
Patient ID: Mark Pruitt, male   DOB: 11/26/1940, 81 y.o.   MRN: [id number]   Virtual Visit via Telephone Note  I connected with [name] on 07/17/22 at 10:00 AM EDT by telephone and verified that I am speaking with the correct person using two identifiers.   I discussed the limitations, risks, security and privacy concerns of performing an evaluation and management service by telephone and the availability of in person appointments. I also discussed with the patient that there may be a patient responsible charge related to this service. The patient expressed understanding and agreed to proceed.  Location patient: home Location provider: work or home office Participants present for the call: patient, provider Patient did not have a visit in the prior 7 days to address this/these issue(s).   History of Present Illness: Mark Pruitt has COVID by home test this morning.  He states he was exposed to someone with COVID last week.  He developed symptoms 2 days ago on Sunday of cough and now has body aches with fatigue and low-grade fever.  Pulse oximeter this morning 95%.  Denies any nausea, vomiting, or diarrhea.  No dyspnea at rest.  Wife thus far is asymptomatic  Past Medical History:  Diagnosis Date   Cancer [facility])    prostate cancer   Cataract    COLONIC POLYPS 07/25/2009   ELEVATED PROSTATE SPECIFIC ANTIGEN 07/27/2009   Hearing loss of both ears    HYPERTROPHY PROSTATE W/UR OBST & OTH LUTS 07/25/2009   Irregular heart beat    LICHEN SIMPLEX CHRONICUS 02/03/2009   Other primary cardiomyopathies 08/08/2010   Past Surgical History:  Procedure Laterality Date   COLONOSCOPY     LEFT HEART CATH AND CORONARY ANGIOGRAPHY N/A 01/13/2020   Procedure: LEFT HEART CATH AND CORONARY ANGIOGRAPHY;  Surgeon: Jettie Booze, MD;  Location: [facility];  Service: Cardiovascular;  Laterality: N/A;   POLYPECTOMY     PROSTATE SURGERY      reports that he quit smoking about  38 years ago. His smoking use included cigarettes. He has a 20.00 pack-year smoking history. He has never used smokeless tobacco. He reports that he does not drink alcohol and does not use drugs. family history includes Anuerysm (age of onset: 69) in his father; Cancer in his mother. No Known Allergies    Observations/Objective: Patient sounds cheerful and well on the phone. I do not appreciate any SOB. Speech and thought processing are grossly intact. Patient reported vitals:  Assessment and Plan:  COVID infection.  Patient is doing relatively well this time with O2 sats of 95%.  Given his age and other comorbidities recommend antiviral therapy.  He has history of relatively normal renal function with most recent GFR of 60.  Sent in Paxlovid 3 capsules twice daily for 5 days.  Plenty of fluids and rest.  Patient aware of isolation recommendations  Follow Up Instructions:   [id number] [id number] [id number] 11-20 [id number] 21-30 I did not refer this patient for an OV in the next 24 hours for this/these issue(s).  I discussed the assessment and treatment plan with the patient. The patient was provided an opportunity to ask questions and all were answered. The patient agreed with the plan and demonstrated an understanding of the instructions.   The patient was advised to call back or seek an in-person evaluation if the symptoms worsen or if the condition fails to improve as anticipated.  I provided 16 minutes of non-face-to-face time during this encounter.  Carolann Littler, MD

## 2022-07-27 ENCOUNTER — Ambulatory Visit: Payer: Medicare Other | Attending: Cardiology | Admitting: Cardiology

## 2022-07-27 ENCOUNTER — Encounter: Payer: Self-pay | Admitting: Cardiology

## 2022-07-27 VITALS — BP 100/70 | HR 67 | Ht 71.0 in | Wt 180.0 lb

## 2022-07-27 DIAGNOSIS — I493 Ventricular premature depolarization: Secondary | ICD-10-CM | POA: Diagnosis not present

## 2022-07-27 DIAGNOSIS — I420 Dilated cardiomyopathy: Secondary | ICD-10-CM | POA: Diagnosis not present

## 2022-07-27 NOTE — Patient Instructions (Signed)
Medication Instructions:  The current medical regimen is effective;  continue present plan and medications.  *If you need a refill on your cardiac medications before your next appointment, please call your pharmacy*  Follow-Up: At [facility], you and your health needs are our priority.  As part of our continuing mission to provide you with exceptional heart care, we have created designated Provider Care Teams.  These Care Teams include your primary Cardiologist (physician) and Advanced Practice Providers (APPs -  Physician Assistants and Nurse Practitioners) who all work together to provide you with the care you need, when you need it.  We recommend signing up for the patient portal called "MyChart".  Sign up information is provided on this After Visit Summary.  MyChart is used to connect with patients for Virtual Visits (Telemedicine).  Patients are able to view lab/test results, encounter notes, upcoming appointments, etc.  Non-urgent messages can be sent to your provider as well.   To learn more about what you can do with MyChart, go to [email or web address].    Your next appointment:   1 year(s)  The format for your next appointment:   In Person  Provider:   Mark Skains, MD      Important Information About Sugar       

## 2022-07-27 NOTE — Progress Notes (Signed)
Cardiology Office Note:    Date:  07/27/2022   ID:  Mark Pruitt, DOB Oct 18, 1940, MRN [id number]  PCP:  Eulas Post, MD  [facility] Cardiologist:  Candee Furbish, MD  [facility] Electrophysiologist:  None   Referring MD: Eulas Post, MD    History of Present Illness:    Mark Pruitt is a 81 y.o. male here for follow-up of cardiomyopathy EF originally 30% now 65, frequent PVCs 8%, no CAD on heart cath.   He was seen via video visit 07/17/22 by Carolann Littler, MD. He presented with home test revealing COVID. He stated that he developed symptoms two days prior, with a cough, body aches, fatigue, and low grade fever, after a COVID exposure the previous week. He was recommended antiviral therapy and was sent Paxlovid 3 capsules to be taken twice daily for 5 days.  NYHA class II.  Had visit with Dr. Lovena Le. Secondary to PVCs.  Overall has been doing quite well.  No new complaints.  No chest pain fevers chills nausea vomiting syncope bleeding.  Tolerating medications well.  He was having some dizziness at times when he was getting up.  His blood pressure was quite low at 80/40.  We made some dose adjustments to his medications, cutting his metoprolol and lisinopril in half.  Today, he says he is doing well.  He states that sometimes when he bends over and stands up he will experience some lightheadedness. He moves slowly and carefully in these instances.    He has been playing golf recently and has been enjoying it.   He and his wife moved recently and have been adjusting to a slightly smaller cottage near the golf [place] he usually goes to. They have been enjoying not having to cook their meals due to convenience of nearby restaurants.   He denies any palpitations, chest pain, shortness of breath, or peripheral edema. No headaches, syncope, orthopnea, or PND.  Past Medical History:  Diagnosis Date   Cancer [facility])    prostate cancer   Cataract     COLONIC POLYPS 07/25/2009   ELEVATED PROSTATE SPECIFIC ANTIGEN 07/27/2009   Hearing loss of both ears    HYPERTROPHY PROSTATE W/UR OBST & OTH LUTS 07/25/2009   Irregular heart beat    LICHEN SIMPLEX CHRONICUS 02/03/2009   Other primary cardiomyopathies 08/08/2010    Past Surgical History:  Procedure Laterality Date   COLONOSCOPY     LEFT HEART CATH AND CORONARY ANGIOGRAPHY N/A 01/13/2020   Procedure: LEFT HEART CATH AND CORONARY ANGIOGRAPHY;  Surgeon: Jettie Booze, MD;  Location: [facility];  Service: Cardiovascular;  Laterality: N/A;   POLYPECTOMY     PROSTATE SURGERY      Current Medications: Current Meds  Medication Sig   Bioflavonoid Products (ESTER-C) TABS Take 1 tablet by mouth daily.   cholecalciferol (VITAMIN D) 25 MCG (1000 UNIT) tablet Take 1,000 Units by mouth daily.   lisinopril (ZESTRIL) 2.5 MG tablet Take 1 tablet (2.5 mg total) by mouth daily.   metoprolol succinate (TOPROL-XL) 25 MG 24 hr tablet Take 1 tablet (25 mg total) by mouth daily.   Multiple Vitamin (MULTIVITAMIN WITH MINERALS) TABS tablet Take 1 tablet by mouth daily.   Omega-3 Fatty Acids (FISH OIL) 1000 MG CAPS Take 1,000 mg by mouth daily.      Allergies:   Patient has no known allergies.   Social History   Socioeconomic History   Marital status: Married    Spouse  name: Not on file   Number of children: Not on file   Years of education: Not on file   Highest education level: Not on file  Occupational History   Not on file  Tobacco Use   Smoking status: Former    Packs/day: 1.00    Years: 20.00    Total pack years: 20.00    Types: Cigarettes    Quit date: 10/22/1983    Years since quitting: 38.7   Smokeless tobacco: Never  Vaping Use   Vaping Use: Never used  Substance and Sexual Activity   Alcohol use: No    Alcohol/week: 0.0 standard drinks of alcohol   Drug use: No   Sexual activity: Not on file  Other Topics Concern   Not on file  Social History Narrative   [place]    Social Determinants of Health   Financial Resource Strain: Low Risk  (11/13/2021)   Overall Financial Resource Strain (CARDIA)    Difficulty of Paying Living Expenses: Not hard at all  Food Insecurity: No Food Insecurity (11/13/2021)   Hunger Vital Sign    Worried About Running Out of Food in the Last Year: Never true    [place] in the Last Year: Never true  Transportation Needs: No Transportation Needs (11/13/2021)   PRAPARE - Hydrologist (Medical): No    Lack of Transportation (Non-Medical): No  Physical Activity: Insufficiently Active (11/13/2021)   Exercise Vital Sign    Days of Exercise per Week: 2 days    Minutes of Exercise per Session: 30 min  Stress: No Stress Concern Present (11/13/2021)   [place]    Feeling of Stress : Not at all  Social Connections: [place] (11/13/2021)   Social Connection and Isolation Panel [NHANES]    Frequency of Communication with Friends and Family: More than three times a week    Frequency of Social Gatherings with Friends and Family: Twice a week    Attends Religious Services: More than 4 times per year    Active Member of [employer] or Organizations: Yes    Attends Music therapist: More than 4 times per year    Marital Status: Married     Family History: The patient's family history includes Anuerysm (age of onset: 69) in his father; Cancer in his mother. There is no history of Arthritis, Colon cancer, Esophageal cancer, Rectal cancer, or Stomach cancer.  ROS:   Please see the history of present illness.    (+) Occasional lightheadedness   All other systems reviewed and are negative.  EKGs/Labs/Other Studies Reviewed:    The following studies were reviewed today:  Echo 04/29/2020: 1. Inferior basal hypokinesis Normal GLS -19.4. Left ventricular ejection  fraction, by estimation, is 55%. The  left ventricle has normal function.  The left ventricle demonstrates regional wall motion abnormalities (see  scoring diagram/findings for  description). The left ventricular internal cavity size was mildly  dilated. There is mild left ventricular hypertrophy. Left ventricular  diastolic parameters were normal.   2. Right ventricular systolic function is normal. The right ventricular  size is normal. There is normal pulmonary artery systolic pressure.   3. Left atrial size was mildly dilated.   4. The mitral valve is normal in structure. Mild mitral valve  regurgitation. No evidence of mitral stenosis.   5. The aortic valve is normal in structure. Aortic valve regurgitation is  mild. No aortic stenosis is present.   6. The inferior vena cava is normal in size with greater than 50%  respiratory variability, suggesting right atrial pressure of 3 mmHg.    Left Heart Cath 01/13/2020:  There is moderate left ventricular systolic dysfunction. The left ventricular ejection fraction is 25-35% by visual estimate. LV end diastolic pressure is normal. There is no aortic valve stenosis. Ectasia in all three coronary arteries. Paroxysmal SVT noted with a HR to 115 bpm. Patient was asymptomatic.   ECHO 12/29/19:    1. Left ventricular ejection fraction, by estimation, is 40 to 45%. The  left ventricle has mildly decreased function. The left ventricle  demonstrates global hypokinesis, most pronounced at base with improvement  in function towards apex. The left  ventricular internal cavity size was mildly dilated. There is mild left  ventricular hypertrophy. Left ventricular diastolic function could not be  evaluated, as tissue Doppler was not done.   2. Right ventricular systolic function is normal. The right ventricular  size is normal. There is normal pulmonary artery systolic pressure. The  estimated right ventricular systolic pressure is 76.7 mmHg.   3. The mitral valve is normal in  structure. Mild mitral valve  regurgitation.   4. The aortic valve is tricuspid. Aortic valve regurgitation is trivial.  No aortic stenosis is present.   5. Aortic dilatation noted. There is mild dilatation of the ascending  aorta measuring 37 mm.   6. The inferior vena cava is normal in size with greater than 50%  respiratory variability, suggesting right atrial pressure of 3 mmHg   [place]: Sinus rhythm with frequent isolated PVC's (12%) Rare episodes of slow ventricular tachycardia (6-8 beats with avg of 127 bpm) Brief paroxysmal atrial tachycardia episodes No atrial fibrillation, no pauses    ECHO 03/2020:   1. Inferior basal hypokinesis Normal GLS -19.4. Left ventricular ejection  fraction, by estimation, is 55%. The left ventricle has normal function.  The left ventricle demonstrates regional wall motion abnormalities (see  scoring diagram/findings for  description). The left ventricular internal cavity size was mildly  dilated. There is mild left ventricular hypertrophy. Left ventricular  diastolic parameters were normal.   2. Right ventricular systolic function is normal. The right ventricular  size is normal. There is normal pulmonary artery systolic pressure.   3. Left atrial size was mildly dilated.   4. The mitral valve is normal in structure. Mild mitral valve  regurgitation. No evidence of mitral stenosis.   5. The aortic valve is normal in structure. Aortic valve regurgitation is  mild. No aortic stenosis is present.   6. The inferior vena cava is normal in size with greater than 50%  respiratory variability, suggesting right atrial pressure of 3 mmHg.    EKG: EKG is personally reviewed. 07/27/22: Sinus Rhythm. Rate 67 bpm. PACs. Nonspecific ST changes.  Recent Labs: No results found for requested labs within last 365 days.  Recent Lipid Panel    Component Value Date/Time   CHOL 160 05/24/2021 1419   TRIG 111.0 05/24/2021 1419   HDL 54.80 05/24/2021 1419    CHOLHDL 3 05/24/2021 1419   VLDL 22.2 05/24/2021 1419   LDLCALC 83 05/24/2021 1419   LDLCALC 86 05/20/2020 1115     Risk Assessment/Calculations:      Physical Exam:    VS:  BP 100/70 (BP Location: Left Arm, Patient Position: Sitting, Cuff Size: Normal)   Pulse 67   Ht '5\' 11"'$  (1.803 m)   Wt  180 lb (81.6 kg)   BMI 25.10 kg/m     Wt Readings from Last 3 Encounters:  07/27/22 180 lb (81.6 kg)  07/17/22 184 lb (83.5 kg)  11/13/21 184 lb (83.5 kg)     GEN:  Well nourished, well developed in no acute distress HEENT: Normal NECK: No JVD; No carotid bruits LYMPHATICS: No lymphadenopathy CARDIAC: RRR, no murmurs, rubs, gallops RESPIRATORY:  Clear to auscultation without rales, wheezing or rhonchi  ABDOMEN: Soft, non-tender, non-distended MUSCULOSKELETAL:  No edema; No deformity  SKIN: Warm and dry NEUROLOGIC:  Alert and oriented x 3 PSYCHIATRIC:  Normal affect   ASSESSMENT:    1. Dilated cardiomyopathy ([place])   2. PVC's (premature ventricular contractions)     PLAN:    In order of problems listed above:  Dilated cardiomyopathy-nonischemic EF 33% on stress test 12/29/2019 Frequent PVCs-8% event monitor, also had frequent PACs. -Appreciate Dr. Tanna Furry consult.  He states that he had 8% PVCs and he was minimally symptomatic.  Does not require ablative therapy.  - Blood pressures were relatively soft so we resorted to Toprol 25 mg a day.Marland Kitchen  He is also on very low-dose lisinopril 2.5 mg a day.  I do not think he would necessarily be able to tolerate Entresto because of soft blood pressure.  Still has some orthostatic symptoms at times. -Thankfully, currently NYHA class 2.  Enjoys golf.  No high risk symptoms such as syncope. -Appreciate Dr. Tanna Furry advice.   Cardiac catheterization EF was 25-35, nuclear EF was 33, echo EF was 40-45, some discrepancies noted.   Most recent echocardiogram in July showed restoration of EF 55%    Prior diffuse chest  pressure/palpitations -Thankfully, no CAD.  Increasing Toprol at prior visit.  Had generalized ectatic arteries. - Okay to stop aspirin.  He states that he has not been taking.  Hypotension -Improved after decreasing his Toprol to 25 mg and decrease his lisinopril to 2.5 mg.  He is requesting 90-day supply to [place] in [facility].     Follow up: 1 year.   Medication Adjustments/Labs and Tests Ordered: Current medicines are reviewed at length with the patient today.  Concerns regarding medicines are outlined above.  Orders Placed This Encounter  Procedures   EKG 12-Lead   No orders of the defined types were placed in this encounter.   Patient Instructions  Medication Instructions:  The current medical regimen is effective;  continue present plan and medications.  *If you need a refill on your cardiac medications before your next appointment, please call your pharmacy*  Follow-Up: At [facility], you and your health needs are our priority.  As part of our continuing mission to provide you with exceptional heart care, we have created designated Provider Care Teams.  These Care Teams include your primary Cardiologist (physician) and Advanced Practice Providers (APPs -  Physician Assistants and Nurse Practitioners) who all work together to provide you with the care you need, when you need it.  We recommend signing up for the patient portal called "MyChart".  Sign up information is provided on this After Visit Summary.  MyChart is used to connect with patients for Virtual Visits (Telemedicine).  Patients are able to view lab/test results, encounter notes, upcoming appointments, etc.  Non-urgent messages can be sent to your provider as well.   To learn more about what you can do with MyChart, go to [email or web address].    Your next appointment:   1 year(s)  The format  for your next appointment:   In Person  Provider:   Candee Furbish, MD     Important  Information About Sugar          I,Breanna Adamick,acting as a scribe for Candee Furbish, MD.,have documented all relevant documentation on the behalf of Candee Furbish, MD,as directed by  Candee Furbish, MD while in the presence of Candee Furbish, MD.  I, Candee Furbish, MD, have reviewed all documentation for this visit. The documentation on 07/27/22 for the exam, diagnosis, procedures, and orders are all accurate and complete.    Signed, Candee Furbish, MD  07/27/2022 9:26 AM    [facility]

## 2022-08-22 ENCOUNTER — Encounter: Payer: Self-pay | Admitting: Family Medicine

## 2022-08-22 ENCOUNTER — Ambulatory Visit (INDEPENDENT_AMBULATORY_CARE_PROVIDER_SITE_OTHER): Payer: Medicare Other | Admitting: Family Medicine

## 2022-08-22 VITALS — BP 116/60 | HR 55 | Temp 98.0°F | Ht 71.0 in | Wt 184.7 lb

## 2022-08-22 DIAGNOSIS — H66003 Acute suppurative otitis media without spontaneous rupture of ear drum, bilateral: Secondary | ICD-10-CM

## 2022-08-22 MED ORDER — AMOXICILLIN-POT CLAVULANATE 875-125 MG PO TABS: 1.0000 | ORAL_TABLET | Freq: Two times a day (BID) | ORAL | 0 refills | Status: DC

## 2022-08-22 NOTE — Progress Notes (Signed)
Established Patient Office Visit  Subjective   Patient ID: DILLON LIVERMORE, male    DOB: 1940-12-13  Age: 81 y.o. MRN: [id number]  Chief Complaint  Patient presents with   Cerumen Impaction    HPI   Patient here to have the ears checked.  He saw audiologist last Friday and he states he noticed some "irritation "of the ear.  He could not be more specific.  He has had some problems recently with the hearing aids and was going back to get molds for new ones but they were hesitant until was checked out.  He states couple weeks ago he did have some bilateral ear pain right greater than left.  Denies any current sinus congestion.  No ear drainage.  Chronic bilateral hearing loss.  Past Medical History:  Diagnosis Date   Cancer [facility])    prostate cancer   Cataract    COLONIC POLYPS 07/25/2009   ELEVATED PROSTATE SPECIFIC ANTIGEN 07/27/2009   Hearing loss of both ears    HYPERTROPHY PROSTATE W/UR OBST & OTH LUTS 07/25/2009   Irregular heart beat    LICHEN SIMPLEX CHRONICUS 02/03/2009   Other primary cardiomyopathies 08/08/2010   Past Surgical History:  Procedure Laterality Date   COLONOSCOPY     LEFT HEART CATH AND CORONARY ANGIOGRAPHY N/A 01/13/2020   Procedure: LEFT HEART CATH AND CORONARY ANGIOGRAPHY;  Surgeon: Jettie Booze, MD;  Location: [facility];  Service: Cardiovascular;  Laterality: N/A;   POLYPECTOMY     PROSTATE SURGERY      reports that he quit smoking about 38 years ago. His smoking use included cigarettes. He has a 20.00 pack-year smoking history. He has never used smokeless tobacco. He reports that he does not drink alcohol and does not use drugs. family history includes Anuerysm (age of onset: 42) in his father; Cancer in his mother. No Known Allergies  Review of Systems  Constitutional:  Negative for chills and fever.  HENT:  Positive for ear pain and hearing loss. Negative for congestion, ear discharge and sinus pain.       Objective:      BP 116/60 (BP Location: Left Arm, Patient Position: Sitting, Cuff Size: Normal)   Pulse (!) 55   Temp 98 F (36.7 C) (Oral)   Ht '5\' 11"'$  (1.803 m)   Wt 184 lb 11.2 oz (83.8 kg)   SpO2 98%   BMI 25.76 kg/m    Physical Exam Vitals reviewed.  Constitutional:      Appearance: Normal appearance.  HENT:     Ears:     Comments: He does not have any cerumen in either canal.  No signs of inflammation external canal.  Both TMs are somewhat distorted.  He appears to have some cream-colored fluid especially on the inferior portion of the right TM.  Mild erythema.  No obvious perforation. Cardiovascular:     Rate and Rhythm: Normal rate and regular rhythm.  Neurological:     Mental Status: He is alert.      No results found for any visits on 08/22/22.    The ASCVD Risk score (Arnett DK, et al., 2019) failed to calculate for the following reasons:   The 2019 ASCVD risk score is only valid for ages 51 to 88    Assessment & Plan:   Otitis media.  Patient has evidence for fluid behind both eardrums especially right.  This is cream-colored and suggest possible suppurative changes.  -Start Augmentin 875 mg twice daily  for 10 days -Set up 2-week follow-up to reassess  Return in about 2 weeks (around 09/05/2022).    Carolann Littler, MD

## 2022-09-05 ENCOUNTER — Ambulatory Visit (INDEPENDENT_AMBULATORY_CARE_PROVIDER_SITE_OTHER): Payer: Medicare Other | Admitting: Family Medicine

## 2022-09-05 ENCOUNTER — Encounter: Payer: Self-pay | Admitting: Family Medicine

## 2022-09-05 VITALS — BP 116/60 | HR 76 | Temp 97.7°F | Ht 71.0 in | Wt 183.9 lb

## 2022-09-05 DIAGNOSIS — H66003 Acute suppurative otitis media without spontaneous rupture of ear drum, bilateral: Secondary | ICD-10-CM

## 2022-09-05 NOTE — Progress Notes (Signed)
   Established Patient Office Visit  Subjective   Patient ID: Mark Pruitt, male    DOB: [birth year]  Age: 81 y.o. MRN: [id number]  Chief Complaint  Patient presents with   Follow-up    HPI   Mark Pruitt is seen for follow-up regarding bilateral suppurative otitis.  We treated him with Augmentin and he completed the therapy.  He has essentially no ear symptoms now other than occasional fullness sensation in the right ear.  No pain.  No drainage.  No fevers or chills.  He does have chronic bilateral sensorineural hearing loss and has hearing aids bilaterally.  We did not see any cerumen previously.  He denies any vertigo or dizziness.  Past Medical History:  Diagnosis Date   Cancer [facility])    prostate cancer   Cataract    COLONIC POLYPS 07/25/2009   ELEVATED PROSTATE SPECIFIC ANTIGEN 07/27/2009   Hearing loss of both ears    HYPERTROPHY PROSTATE W/UR OBST & OTH LUTS 07/25/2009   Irregular heart beat    LICHEN SIMPLEX CHRONICUS 02/03/2009   Other primary cardiomyopathies 08/08/2010   Past Surgical History:  Procedure Laterality Date   COLONOSCOPY     LEFT HEART CATH AND CORONARY ANGIOGRAPHY N/A 01/13/2020   Procedure: LEFT HEART CATH AND CORONARY ANGIOGRAPHY;  Surgeon: Jettie Booze, MD;  Location: [facility];  Service: Cardiovascular;  Laterality: N/A;   POLYPECTOMY     PROSTATE SURGERY      reports that he quit smoking about 38 years ago. His smoking use included cigarettes. He has a 20.00 pack-year smoking history. He has never used smokeless tobacco. He reports that he does not drink alcohol and does not use drugs. family history includes Anuerysm (age of onset: 47) in his father; Cancer in his mother. No Known Allergies  Review of Systems  Constitutional:  Negative for chills and fever.  HENT:  Negative for congestion, ear discharge and ear pain.   Respiratory:  Negative for cough and shortness of breath.       Objective:     BP 116/60 (BP  Location: Left Arm, Patient Position: Sitting, Cuff Size: Normal)   Pulse 76   Temp 97.7 F (36.5 C) (Oral)   Ht '5\' 11"'$  (1.803 m)   Wt 183 lb 14.4 oz (83.4 kg)   SpO2 98%   BMI 25.65 kg/m    Physical Exam Vitals reviewed.  Constitutional:      Appearance: Normal appearance.  HENT:     Ears:     Comments: Ear canals are clear bilaterally.  No evidence for any erythema involving either eardrum.  He has resolving suppurative changes of the right eardrum.  Left is essentially normal at this time.  No bulging.  Normal landmarks. Cardiovascular:     Rate and Rhythm: Normal rate and regular rhythm.  Pulmonary:     Effort: Pulmonary effort is normal.     Breath sounds: Normal breath sounds.  Neurological:     Mental Status: He is alert.      No results found for any visits on 09/05/22.    The ASCVD Risk score (Arnett DK, et al., 2019) failed to calculate for the following reasons:   The 2019 ASCVD risk score is only valid for ages 63 to 74    Assessment & Plan:   Recent bilateral suppurative otitis media resolving.  No indication for further antibiotics at this time.  Follow-up as needed.  Carolann Littler, MD

## 2022-09-05 NOTE — Patient Instructions (Signed)
Ear drums look improved- infection appears to be clearing  Follow up for any recurrent ear symptoms.

## 2022-10-02 ENCOUNTER — Ambulatory Visit (INDEPENDENT_AMBULATORY_CARE_PROVIDER_SITE_OTHER): Payer: Medicare HMO | Admitting: Family

## 2022-10-02 ENCOUNTER — Telehealth: Payer: Self-pay

## 2022-10-02 VITALS — BP 116/70 | HR 88 | Temp 97.7°F | Wt 187.8 lb

## 2022-10-02 DIAGNOSIS — J019 Acute sinusitis, unspecified: Secondary | ICD-10-CM

## 2022-10-02 DIAGNOSIS — R051 Acute cough: Secondary | ICD-10-CM | POA: Diagnosis not present

## 2022-10-02 DIAGNOSIS — B9689 Other specified bacterial agents as the cause of diseases classified elsewhere: Secondary | ICD-10-CM

## 2022-10-02 MED ORDER — FLUTICASONE PROPIONATE 50 MCG/ACT NA SUSP: 2.0000 | Freq: Every day | NASAL | 6 refills | Status: DC

## 2022-10-02 MED ORDER — AMOXICILLIN 500 MG PO CAPS: 500.0000 mg | ORAL_CAPSULE | Freq: Three times a day (TID) | ORAL | 0 refills | Status: AC

## 2022-10-02 NOTE — Telephone Encounter (Signed)
---  Caller states he took one of his wife's medications by accident, Amlodipine '5mg'$ . He just took it 5 minutes ago. No side effects as yet. He does take his own heart and blood pressure. He took his Lisinopril 2.5 mg & Metoprolol '25mg'$  at 9AM. He is on an antibiotic right now & that bottle & his wife's Amlodipine bottle that look alike were sitting by each other, & he mistook it for his antibiotic  10/02/2022 3:50:36 PM Call [facility] [place], [place], [place]  10/02/22 1645 - Pt states he called Poison Control. Was told to monitor BP for 6hrs, stay hydrated, & watch for s/s of hypotension (lightheadness, weakness, dizziness). Pt advised of the same, also told to elevate feet if needed. Pt verb understanding & denies further ques/concerns. P Webb aware.

## 2022-10-02 NOTE — Progress Notes (Unsigned)
Acute Office Visit  Subjective:     Patient ID: Mark Pruitt, male    DOB: 09/19/1941, 82 y.o.   MRN: [id number]  Chief Complaint  Patient presents with   Cough    Pt reports productive cough, yellow phlegms. Started last Wednesday. Reports sx of nasal congestion, fatigue, slight body ache. Home covid test 5 days ago and yesterday- both are negative.    HPI Patient is in today with c/o cough, congestion, fatigue and body ache x 6 days. Has taken home COVID tests that have been negative. Feels he may have contracted something from church or over the holidays.   Review of Systems  Constitutional:  Positive for malaise/fatigue.  HENT:  Positive for congestion.   Respiratory:  Positive for cough.   Cardiovascular: Negative.   Musculoskeletal: Negative.   Skin: Negative.   Neurological: Negative.   Psychiatric/Behavioral: Negative.    All other systems reviewed and are negative.  Past Medical History:  Diagnosis Date   Cancer [facility])    prostate cancer   Cataract    COLONIC POLYPS 07/25/2009   ELEVATED PROSTATE SPECIFIC ANTIGEN 07/27/2009   Hearing loss of both ears    HYPERTROPHY PROSTATE W/UR OBST & OTH LUTS 07/25/2009   Irregular heart beat    LICHEN SIMPLEX CHRONICUS 02/03/2009   Other primary cardiomyopathies 08/08/2010    Social History   Socioeconomic History   Marital status: Married    Spouse name: Not on file   Number of children: Not on file   Years of education: Not on file   Highest education level: Not on file  Occupational History   Not on file  Tobacco Use   Smoking status: Former    Packs/day: 1.00    Years: 20.00    Total pack years: 20.00    Types: Cigarettes    Quit date: 10/22/1983    Years since quitting: 38.9   Smokeless tobacco: Never  Vaping Use   Vaping Use: Never used  Substance and Sexual Activity   Alcohol use: No    Alcohol/week: 0.0 standard drinks of alcohol   Drug use: No   Sexual activity: Not on file  Other Topics  Concern   Not on file  Social History Narrative   From [place] originally    Social Determinants of Health   Financial Resource Strain: Low Risk  (11/13/2021)   Overall Financial Resource Strain (CARDIA)    Difficulty of Paying Living Expenses: Not hard at all  Food Insecurity: No Food Insecurity (11/13/2021)   Hunger Vital Sign    Worried About Running Out of Food in the Last Year: Never true    Ran Out of Food in the Last Year: Never true  Transportation Needs: No Transportation Needs (11/13/2021)   PRAPARE - Hydrologist (Medical): No    Lack of Transportation (Non-Medical): No  Physical Activity: Insufficiently Active (11/13/2021)   Exercise Vital Sign    Days of Exercise per Week: 2 days    Minutes of Exercise per Session: 30 min  Stress: No Stress Concern Present (11/13/2021)   [place]    Feeling of Stress : Not at all  Social Connections: [place] (11/13/2021)   Social Connection and Isolation Panel [NHANES]    Frequency of Communication with Friends and Family: More than three times a week    Frequency of Social Gatherings with Friends and Family: Twice a week  Attends Religious Services: More than 4 times per year    Active Member of Clubs or Organizations: Yes    Attends Archivist Meetings: More than 4 times per year    Marital Status: Married  Human resources officer Violence: Not At Risk (11/13/2021)   Humiliation, Afraid, Rape, and Kick questionnaire    Fear of Current or Ex-Partner: No    Emotionally Abused: No    Physically Abused: No    Sexually Abused: No    Past Surgical History:  Procedure Laterality Date   COLONOSCOPY     LEFT HEART CATH AND CORONARY ANGIOGRAPHY N/A 01/13/2020   Procedure: LEFT HEART CATH AND CORONARY ANGIOGRAPHY;  Surgeon: Jettie Booze, MD;  Location: [facility];  Service: Cardiovascular;  Laterality: N/A;    POLYPECTOMY     PROSTATE SURGERY      Family History  Problem Relation Age of Onset   Cancer Mother        parathyroid adenoma   Anuerysm Father 41       abdominal   Arthritis Neg Hx        family hx   Colon cancer Neg Hx    Esophageal cancer Neg Hx    Rectal cancer Neg Hx    Stomach cancer Neg Hx     No Known Allergies  Current Outpatient Medications on File Prior to Visit  Medication Sig Dispense Refill   Bioflavonoid Products (ESTER-C) TABS Take 1 tablet by mouth daily.     cholecalciferol (VITAMIN D) 25 MCG (1000 UNIT) tablet Take 1,000 Units by mouth daily.     lisinopril (ZESTRIL) 2.5 MG tablet Take 1 tablet (2.5 mg total) by mouth daily. 90 tablet 3   metoprolol succinate (TOPROL-XL) 25 MG 24 hr tablet Take 1 tablet (25 mg total) by mouth daily. 90 tablet 3   Multiple Vitamin (MULTIVITAMIN WITH MINERALS) TABS tablet Take 1 tablet by mouth daily.     Omega-3 Fatty Acids (FISH OIL) 1000 MG CAPS Take 1,000 mg by mouth daily.      No current facility-administered medications on file prior to visit.    BP 116/70 (BP Location: Right Arm, Patient Position: Sitting, Cuff Size: Normal)   Pulse 88   Temp 97.7 F (36.5 C) (Oral)   Wt 187 lb 12.8 oz (85.2 kg)   SpO2 99%   BMI 26.19 kg/m chart      Objective:    BP 116/70 (BP Location: Right Arm, Patient Position: Sitting, Cuff Size: Normal)   Pulse 88   Temp 97.7 F (36.5 C) (Oral)   Wt 187 lb 12.8 oz (85.2 kg)   SpO2 99%   BMI 26.19 kg/m    Physical Exam Vitals and nursing note reviewed.  Constitutional:      Appearance: Normal appearance. He is normal weight.  HENT:     Right Ear: Tympanic membrane, ear canal and external ear normal.     Left Ear: Tympanic membrane, ear canal and external ear normal.     Nose: Congestion present.     Mouth/Throat:     Mouth: Mucous membranes are moist.  Cardiovascular:     Rate and Rhythm: Regular rhythm. Tachycardia present.     Pulses: Normal pulses.     Heart sounds:  Normal heart sounds.  Pulmonary:     Effort: Pulmonary effort is normal.     Breath sounds: Normal breath sounds.  Abdominal:     General: Abdomen is flat.  Musculoskeletal:  General: Normal range of motion.  Skin:    General: Skin is warm and dry.  Neurological:     General: No focal deficit present.     Mental Status: He is alert and oriented to person, place, and time.  Psychiatric:        Mood and Affect: Mood normal.        Behavior: Behavior normal.     No results found for any visits on 10/02/22.      Assessment & Plan:   Problem List Items Addressed This Visit   None Visit Diagnoses     Acute bacterial sinusitis    -  Primary   Relevant Medications   amoxicillin (AMOXIL) 500 MG capsule   fluticasone (FLONASE) 50 MCG/ACT nasal spray       Meds ordered this encounter  Medications   amoxicillin (AMOXIL) 500 MG capsule    Sig: Take 1 capsule (500 mg total) by mouth 3 (three) times daily for 10 days.    Dispense:  30 capsule    Refill:  0    Order Specific Question:   Supervising Provider    Answer:   Penni Homans A [[id number]]   fluticasone (FLONASE) 50 MCG/ACT nasal spray    Sig: Place 2 sprays into both nostrils daily.    Dispense:  16 g    Refill:  6    Order Specific Question:   Supervising Provider    Answer:   Penni Homans A [[id number]]    Call the office if symptoms worsen or persist. Recheck as scheduled and sooner as needed.   Kennyth Arnold, FNP

## 2022-10-04 DIAGNOSIS — R69 Illness, unspecified: Secondary | ICD-10-CM | POA: Diagnosis not present

## 2022-10-09 DIAGNOSIS — L738 Other specified follicular disorders: Secondary | ICD-10-CM | POA: Diagnosis not present

## 2022-10-09 DIAGNOSIS — D485 Neoplasm of uncertain behavior of skin: Secondary | ICD-10-CM | POA: Diagnosis not present

## 2022-10-09 DIAGNOSIS — L821 Other seborrheic keratosis: Secondary | ICD-10-CM | POA: Diagnosis not present

## 2022-10-09 DIAGNOSIS — L820 Inflamed seborrheic keratosis: Secondary | ICD-10-CM | POA: Diagnosis not present

## 2022-10-09 DIAGNOSIS — D225 Melanocytic nevi of trunk: Secondary | ICD-10-CM | POA: Diagnosis not present

## 2022-10-15 ENCOUNTER — Telehealth: Payer: Self-pay | Admitting: Family Medicine

## 2022-10-15 DIAGNOSIS — Z8679 Personal history of other diseases of the circulatory system: Secondary | ICD-10-CM

## 2022-10-15 MED ORDER — LISINOPRIL 2.5 MG PO TABS: 2.5000 mg | ORAL_TABLET | Freq: Every day | ORAL | 0 refills | Status: DC

## 2022-10-15 MED ORDER — METOPROLOL SUCCINATE ER 25 MG PO TB24: 25.0000 mg | ORAL_TABLET | Freq: Every day | ORAL | 0 refills | Status: DC

## 2022-10-15 NOTE — Telephone Encounter (Signed)
Pt has new insurance: [employer]   Pt is asking for a refill of the following:  lisinopril (ZESTRIL) 2.5 MG tablet  Requesting 90 day supply with refills  metoprolol succinate (TOPROL-XL) 25 MG 24 hr tablet   Requesting 90 day supply with refills  LOV:  10/02/2022  [id number] [place]: [phone] Fax: [phone]

## 2022-10-15 NOTE — Telephone Encounter (Signed)
Rx sent 

## 2022-10-22 DIAGNOSIS — R69 Illness, unspecified: Secondary | ICD-10-CM | POA: Diagnosis not present

## 2022-12-20 DIAGNOSIS — R69 Illness, unspecified: Secondary | ICD-10-CM | POA: Diagnosis not present

## 2023-01-10 ENCOUNTER — Telehealth: Payer: Self-pay | Admitting: Family Medicine

## 2023-01-10 DIAGNOSIS — Z8679 Personal history of other diseases of the circulatory system: Secondary | ICD-10-CM

## 2023-01-10 NOTE — Telephone Encounter (Signed)
Prescription Request  01/10/2023  LOV: 09/05/2022  What is the name of the medication or equipment? lisinopril (ZESTRIL) 2.5 MG tablet metoprolol succinate (TOPROL-XL) 25 MG 24 hr tablet  Have you contacted your pharmacy to request a refill? No   Which pharmacy would you like this sent to?   [id number] [place], [place] - [address] [place] [address] Phone: [phone] Fax: [phone]    Patient notified that their request is being sent to the clinical staff for review and that they should receive a response within 2 business days.   Please advise at Mobile [phone] (mobile)

## 2023-01-11 ENCOUNTER — Other Ambulatory Visit: Payer: Self-pay | Admitting: Family Medicine

## 2023-01-11 DIAGNOSIS — Z8679 Personal history of other diseases of the circulatory system: Secondary | ICD-10-CM

## 2023-01-11 MED ORDER — METOPROLOL SUCCINATE ER 25 MG PO TB24: 25.0000 mg | ORAL_TABLET | Freq: Every day | ORAL | 1 refills | Status: DC

## 2023-01-11 MED ORDER — LISINOPRIL 2.5 MG PO TABS: 2.5000 mg | ORAL_TABLET | Freq: Every day | ORAL | 1 refills | Status: DC

## 2023-01-11 NOTE — Addendum Note (Signed)
Addended by: Christy Sartorius on: 01/11/2023 08:09 AM   Modules accepted: Orders

## 2023-01-11 NOTE — Telephone Encounter (Signed)
Rx sent 

## 2023-01-15 MED ORDER — LISINOPRIL 2.5 MG PO TABS: 2.5000 mg | ORAL_TABLET | Freq: Every day | ORAL | 0 refills | Status: DC

## 2023-01-15 MED ORDER — METOPROLOL SUCCINATE ER 25 MG PO TB24: 25.0000 mg | ORAL_TABLET | Freq: Every day | ORAL | 0 refills | Status: DC

## 2023-01-15 NOTE — Addendum Note (Signed)
Addended by: Christy Sartorius on: 01/15/2023 04:21 PM   Modules accepted: Orders

## 2023-01-15 NOTE — Telephone Encounter (Signed)
Patient requesting a 90d prescription with refills for both of these medications. Has not picked up the 30d with 1 refill.

## 2023-01-15 NOTE — Telephone Encounter (Signed)
Rx sent and CPE scheduled

## 2023-01-28 ENCOUNTER — Telehealth: Payer: Self-pay | Admitting: Family Medicine

## 2023-01-28 NOTE — Telephone Encounter (Signed)
Contacted [facility] to schedule their annual wellness visit. Appointment made for 01/31/23.  Rudell Cobb AWV direct phone # [phone]

## 2023-01-31 ENCOUNTER — Encounter: Payer: Medicare HMO | Admitting: Family Medicine

## 2023-01-31 DIAGNOSIS — R69 Illness, unspecified: Secondary | ICD-10-CM | POA: Diagnosis not present

## 2023-02-27 DIAGNOSIS — I429 Cardiomyopathy, unspecified: Secondary | ICD-10-CM | POA: Diagnosis not present

## 2023-02-27 DIAGNOSIS — Z87891 Personal history of nicotine dependence: Secondary | ICD-10-CM | POA: Diagnosis not present

## 2023-02-27 DIAGNOSIS — R32 Unspecified urinary incontinence: Secondary | ICD-10-CM | POA: Diagnosis not present

## 2023-02-27 DIAGNOSIS — Z803 Family history of malignant neoplasm of breast: Secondary | ICD-10-CM | POA: Diagnosis not present

## 2023-02-27 DIAGNOSIS — Z008 Encounter for other general examination: Secondary | ICD-10-CM | POA: Diagnosis not present

## 2023-02-27 DIAGNOSIS — N529 Male erectile dysfunction, unspecified: Secondary | ICD-10-CM | POA: Diagnosis not present

## 2023-02-27 DIAGNOSIS — Z974 Presence of external hearing-aid: Secondary | ICD-10-CM | POA: Diagnosis not present

## 2023-02-27 DIAGNOSIS — Z8249 Family history of ischemic heart disease and other diseases of the circulatory system: Secondary | ICD-10-CM | POA: Diagnosis not present

## 2023-02-27 DIAGNOSIS — M1990 Unspecified osteoarthritis, unspecified site: Secondary | ICD-10-CM | POA: Diagnosis not present

## 2023-02-27 DIAGNOSIS — I10 Essential (primary) hypertension: Secondary | ICD-10-CM | POA: Diagnosis not present

## 2023-03-14 DIAGNOSIS — H1045 Other chronic allergic conjunctivitis: Secondary | ICD-10-CM | POA: Diagnosis not present

## 2023-03-14 DIAGNOSIS — H11153 Pinguecula, bilateral: Secondary | ICD-10-CM | POA: Diagnosis not present

## 2023-03-14 DIAGNOSIS — H43813 Vitreous degeneration, bilateral: Secondary | ICD-10-CM | POA: Diagnosis not present

## 2023-03-14 DIAGNOSIS — Z961 Presence of intraocular lens: Secondary | ICD-10-CM | POA: Diagnosis not present

## 2023-03-25 ENCOUNTER — Ambulatory Visit (INDEPENDENT_AMBULATORY_CARE_PROVIDER_SITE_OTHER): Payer: Medicare HMO | Admitting: Family Medicine

## 2023-03-25 ENCOUNTER — Encounter: Payer: Self-pay | Admitting: Family Medicine

## 2023-03-25 VITALS — BP 100/60 | HR 75 | Temp 97.9°F | Ht 71.0 in | Wt 181.4 lb

## 2023-03-25 DIAGNOSIS — Z0000 Encounter for general adult medical examination without abnormal findings: Secondary | ICD-10-CM

## 2023-03-25 DIAGNOSIS — Z8679 Personal history of other diseases of the circulatory system: Secondary | ICD-10-CM

## 2023-03-25 LAB — BASIC METABOLIC PANEL WITH GFR
BUN: 21 mg/dL (ref 6–23)
CO2: 27 meq/L (ref 19–32)
Calcium: 10 mg/dL (ref 8.4–10.5)
Chloride: 104 meq/L (ref 96–112)
Creatinine, Ser: 0.99 mg/dL (ref 0.40–1.50)
GFR: 70.4 mL/min (ref >60.00)
Glucose, Bld: 99 mg/dL (ref 70–99)
Potassium: 4.3 meq/L (ref 3.5–5.1)
Sodium: 137 meq/L (ref 135–145)

## 2023-03-25 MED ORDER — METOPROLOL SUCCINATE ER 25 MG PO TB24: 25.0000 mg | ORAL_TABLET | Freq: Every day | ORAL | 3 refills | Status: AC

## 2023-03-25 MED ORDER — LISINOPRIL 2.5 MG PO TABS: 2.5000 mg | ORAL_TABLET | Freq: Every day | ORAL | 3 refills | Status: AC

## 2023-03-25 NOTE — Patient Instructions (Signed)
Consider Shingrix vaccine at some point this year- and may want to check on insurance coverage first.

## 2023-03-25 NOTE — Progress Notes (Signed)
Established Patient Office Visit  Subjective   Patient ID: Mark Pruitt, male    DOB: Feb 09, 1941  Age: 82 y.o. MRN: [id number]  Chief Complaint  Patient presents with   Annual Exam    HPI   Mr. Band is seen today for physical exam.  Past medical history reviewed.  He has history of dilated cardiomyopathy and has been maintained on low-dose metoprolol and lisinopril.  Occasional lightheadedness with standing but infrequently.  He apparently recently had home health nurse visit and they were concerned about his lowish blood pressure and had him hold lisinopril.  He has remained on the lisinopril.  He realizes he may not be hydrating adequately.  Does not have consistent symptoms of dizziness.  Generally doing well.  He and his wife reside at [facility].  He stays active with golf 2 times per week.  Also participating in exercise program there to try to reduce fall risk.  Health maintenance reviewed  -History of Zostavax but not Shingrix.  Pneumonia vaccines complete. -He and his wife get annual flu vaccine -Aged out of additional colonoscopies  Family history and social history reviewed with no significant changes  Past Medical History:  Diagnosis Date   Cancer [facility])    prostate cancer   Cataract    COLONIC POLYPS 07/25/2009   ELEVATED PROSTATE SPECIFIC ANTIGEN 07/27/2009   Hearing loss of both ears    HYPERTROPHY PROSTATE W/UR OBST & OTH LUTS 07/25/2009   Irregular heart beat    LICHEN SIMPLEX CHRONICUS 02/03/2009   Other primary cardiomyopathies 08/08/2010   Past Surgical History:  Procedure Laterality Date   COLONOSCOPY     LEFT HEART CATH AND CORONARY ANGIOGRAPHY N/A 01/13/2020   Procedure: LEFT HEART CATH AND CORONARY ANGIOGRAPHY;  Surgeon: Corky Crafts, MD;  [facility];  Service: Cardiovascular;  Laterality: N/A;   POLYPECTOMY     PROSTATE SURGERY      reports that he quit smoking about 39 years ago. His smoking use included  cigarettes. He has a 20.00 pack-year smoking history. He has never used smokeless tobacco. He reports that he does not drink alcohol and does not use drugs. family history includes Anuerysm (age of onset: 103) in his father; Cancer in his mother. No Known Allergies  Review of Systems  Constitutional:  Negative for chills, fever, malaise/fatigue and weight loss.  HENT:  Negative for hearing loss.   Eyes:  Negative for blurred vision and double vision.  Respiratory:  Negative for cough and shortness of breath.   Cardiovascular:  Negative for chest pain, palpitations and leg swelling.  Gastrointestinal:  Negative for abdominal pain, blood in stool, constipation and diarrhea.  Genitourinary:  Negative for dysuria.  Skin:  Negative for rash.  Neurological:  Negative for dizziness, speech change, seizures, loss of consciousness and headaches.  Psychiatric/Behavioral:  Negative for depression.       Objective:     BP 100/60 (BP Location: Left Arm, Patient Position: Sitting, Cuff Size: Normal)   Pulse 75   Temp 97.9 F (36.6 C) (Oral)   Ht 5\' 11"  (1.803 m)   Wt 181 lb 6.4 oz (82.3 kg)   SpO2 98%   BMI 25.30 kg/m  BP Readings from Last 3 Encounters:  03/25/23 100/60  10/02/22 116/70  09/05/22 116/60   Wt Readings from Last 3 Encounters:  03/25/23 181 lb 6.4 oz (82.3 kg)  10/02/22 187 lb 12.8 oz (85.2 kg)  09/05/22 183 lb 14.4 oz (83.4  kg)         No results found for any visits on 03/25/23.    The ASCVD Risk score (Arnett DK, et al., 2019) failed to calculate for the following reasons:   The 2019 ASCVD risk score is only valid for ages 24 to 97    Assessment & Plan:   Physical exam.  Patient has history of dilated cardiomyopathy years ago with most recent echo revealing ejection fraction 55%.  Denies any dyspnea or orthopnea.  No limitation with day-to-day activities such as golf.  -Refill low-dose lisinopril and metoprolol for 1 year. -Stay well-hydrated -Check basic  metabolic panel -Continue annual flu vaccine -Consider Shingrix at some point this year.  He will check on insurance coverage first -Aged out of further colonoscopy -Pneumonia vaccines complete   No follow-ups on file.    Evelena Peat, MD

## 2023-04-30 DIAGNOSIS — R69 Illness, unspecified: Secondary | ICD-10-CM | POA: Diagnosis not present

## 2023-08-12 ENCOUNTER — Telehealth: Payer: Self-pay | Admitting: Family Medicine

## 2023-08-12 NOTE — Telephone Encounter (Signed)
Spoke to patient to schedule awv He stated he changed pcp to [facility]

## 2024-08-11 ENCOUNTER — Telehealth: Payer: Self-pay | Admitting: Nurse Practitioner

## 2024-08-11 NOTE — Telephone Encounter (Signed)
 Copied from CRM #[id number]. Topic: Appointments - Scheduling Inquiry for Clinic >> Aug 11, 2024 12:14 PM Mark Pruitt wrote: Reason for CRM: Patient called in to inquire if he and his wife could be patients again to Mark Pruitt. They were patients of his for quite a few years but had to move away. They are back in [place] and would like to see if its a possibility that [facility] would accept both him and his wife, Mark Pruitt again.   [phone] (M)

## 2024-08-12 NOTE — Telephone Encounter (Signed)
 Noted

## 2024-08-20 DIAGNOSIS — Z129 Encounter for screening for malignant neoplasm, site unspecified: Secondary | ICD-10-CM | POA: Diagnosis not present

## 2024-08-20 DIAGNOSIS — L814 Other melanin hyperpigmentation: Secondary | ICD-10-CM | POA: Diagnosis not present

## 2024-08-20 DIAGNOSIS — D225 Melanocytic nevi of trunk: Secondary | ICD-10-CM | POA: Diagnosis not present

## 2024-08-20 DIAGNOSIS — L821 Other seborrheic keratosis: Secondary | ICD-10-CM | POA: Diagnosis not present

## 2024-09-02 ENCOUNTER — Ambulatory Visit: Admitting: Family Medicine

## 2024-09-02 ENCOUNTER — Encounter: Payer: Self-pay | Admitting: Family Medicine

## 2024-09-02 VITALS — BP 106/60 | HR 80 | Temp 97.4°F | Wt 177.8 lb

## 2024-09-02 DIAGNOSIS — Z23 Encounter for immunization: Secondary | ICD-10-CM | POA: Diagnosis not present

## 2024-09-02 DIAGNOSIS — I493 Ventricular premature depolarization: Secondary | ICD-10-CM

## 2024-09-02 DIAGNOSIS — I420 Dilated cardiomyopathy: Secondary | ICD-10-CM

## 2024-09-02 NOTE — Patient Instructions (Addendum)
 Consider Shingrix vaccine at local pharmacy  Let's set up follow up in the Spring.

## 2024-09-02 NOTE — Progress Notes (Signed)
 Established Patient Office Visit  Subjective   Patient ID: Mark Pruitt, male    DOB: 03/06/1941  Age: 83 y.o. MRN: [id number]  Chief Complaint  Patient presents with   New Patient (Initial Visit)    HPI   Mark Pruitt and his wife are seen basically to reestablish care.  He and his wife currently live at [facility].  They had been seeing a nurse practitioner who had provided care through their facility but they have had some difficulties dealing with [facility] at times in terms of timely follow-up with lab and x-ray results and are requesting transfer back.  Mark Pruitt is generally healthy.  He stays active with golf and walking dogs several times a day.  He does have past history of dilated cardiomyopathy.  His last echo 2021 showed EF of 55%.  No recent shortness of breath or edema.  Remains on low-dose lisinopril  and Toprol  XL 25 mg daily.  No orthopnea.  No recent chest pains.  Other medical problems include history of PVCs which are mostly asymptomatic.  He has had past history of colon adenomas, remote history of prostate cancer.  He has not had flu vaccine thus far.  Has had Zostavax and would like to consider Shingrix at some point this year.  Pneumonia vaccines complete.  Past Medical History:  Diagnosis Date   Cancer [facility])    prostate cancer   Cataract    COLONIC POLYPS 07/25/2009   ELEVATED PROSTATE SPECIFIC ANTIGEN 07/27/2009   Hearing loss of both ears    HYPERTROPHY PROSTATE W/UR OBST & OTH LUTS 07/25/2009   Irregular heart beat    LICHEN SIMPLEX CHRONICUS 02/03/2009   Other primary cardiomyopathies 08/08/2010   Past Surgical History:  Procedure Laterality Date   COLONOSCOPY     LEFT HEART CATH AND CORONARY ANGIOGRAPHY N/A 01/13/2020   Procedure: LEFT HEART CATH AND CORONARY ANGIOGRAPHY;  Surgeon: Dann Candyce RAMAN, MD;  [facility]: [facility];  Service: Cardiovascular;  Laterality: N/A;   POLYPECTOMY     PROSTATE SURGERY       reports that he quit smoking about 40 years ago. His smoking use included cigarettes. He started smoking about 60 years ago. He has a 20 pack-year smoking history. He has never used smokeless tobacco. He reports that he does not drink alcohol and does not use drugs. family history includes Anuerysm (age of onset: 44) in his father; Cancer in his mother. No Known Allergies  Review of Systems  Constitutional:  Negative for malaise/fatigue.  Eyes:  Negative for blurred vision.  Respiratory:  Negative for cough and shortness of breath.   Cardiovascular:  Negative for chest pain.  Neurological:  Negative for dizziness, weakness and headaches.      Objective:     BP 106/60   Pulse 80   Temp (!) 97.4 F (36.3 C) (Oral)   Wt 177 lb 12.8 oz (80.6 kg)   SpO2 95%   BMI 24.80 kg/m  BP Readings from Last 3 Encounters:  09/02/24 106/60  03/25/23 100/60  10/02/22 116/70   Wt Readings from Last 3 Encounters:  09/02/24 177 lb 12.8 oz (80.6 kg)  03/25/23 181 lb 6.4 oz (82.3 kg)  10/02/22 187 lb 12.8 oz (85.2 kg)      Physical Exam Vitals reviewed.  Constitutional:      General: He is not in acute distress.    Appearance: He is well-developed. He is not ill-appearing.  HENT:  Right Ear: External ear normal.     Left Ear: External ear normal.  Eyes:     Pupils: Pupils are equal, round, and reactive to light.  Neck:     Thyroid : No thyromegaly.  Cardiovascular:     Rate and Rhythm: Normal rate.     Comments: Does have occasional auscultated and palpated premature beats which were asymptomatic. Pulmonary:     Effort: Pulmonary effort is normal. No respiratory distress.     Breath sounds: Normal breath sounds. No wheezing or rales.  Musculoskeletal:     Cervical back: Neck supple.     Right lower leg: No edema.     Left lower leg: No edema.  Neurological:     Mental Status: He is alert and oriented to person, place, and time.      No results found for any visits on  09/02/24.    The ASCVD Risk score (Arnett DK, et al., 2019) failed to calculate for the following reasons:   The 2019 ASCVD risk score is only valid for ages 54 to 64    Assessment & Plan:   #1 history of dilated cardiomyopathy.  Last EF by echo 2021 55%.  Patient asymptomatic.  Maintained on low-dose lisinopril  and metoprolol .  Follow-up immediately for any rapid weight gain, peripheral edema, dyspnea  #2 history of intermittent PVCs.  Mostly asymptomatic.  He does not drink alcohol very often.  Continue to minimize caffeine intake.  Follow-up for any dizziness, shortness of breath, or increasing fatigue.  #3 health maintenance-flu vaccine given.  Discussed Shingrix vaccine which he will consider at local pharmacy.  Set up follow-up in 3 to 4 months and consider follow-up labs at that point.   Return in about 3 months (around 12/01/2024).    Wolm Scarlet, MD
